# Patient Record
Sex: Male | Born: 1940 | Hispanic: Yes | Marital: Married | Smoking: Former smoker
Health system: Southern US, Community
[De-identification: ages and names within clinical notes are randomized; demographics above are authoritative.]

## PROBLEM LIST (undated history)

## (undated) DIAGNOSIS — I240 Acute coronary thrombosis not resulting in myocardial infarction: Secondary | ICD-10-CM

## (undated) DIAGNOSIS — K219 Gastro-esophageal reflux disease without esophagitis: Secondary | ICD-10-CM

## (undated) DIAGNOSIS — I2510 Atherosclerotic heart disease of native coronary artery without angina pectoris: Secondary | ICD-10-CM

## (undated) DIAGNOSIS — R42 Dizziness and giddiness: Secondary | ICD-10-CM

## (undated) DIAGNOSIS — H409 Unspecified glaucoma: Secondary | ICD-10-CM

## (undated) HISTORY — PX: OTHER SURGICAL HISTORY: SHX169

## (undated) HISTORY — PX: APPENDECTOMY: SHX54

## (undated) HISTORY — PX: CARDIAC CATHETERIZATION: SHX172

## (undated) HISTORY — PX: HERNIA REPAIR: SHX51

---

## 2001-11-05 HISTORY — PX: CORONARY ANGIOPLASTY WITH STENT PLACEMENT: SHX49

## 2011-11-06 DIAGNOSIS — I240 Acute coronary thrombosis not resulting in myocardial infarction: Secondary | ICD-10-CM

## 2011-11-06 HISTORY — DX: Acute coronary thrombosis not resulting in myocardial infarction: I24.0

## 2015-02-21 DIAGNOSIS — R42 Dizziness and giddiness: Secondary | ICD-10-CM

## 2015-02-21 HISTORY — DX: Dizziness and giddiness: R42

## 2018-01-20 ENCOUNTER — Encounter: Payer: Self-pay | Admitting: Emergency Medicine

## 2018-01-21 ENCOUNTER — Ambulatory Visit: Payer: Medicare Other | Admitting: Certified Registered Nurse Anesthetist

## 2018-01-21 ENCOUNTER — Encounter: Payer: Self-pay | Admitting: *Deleted

## 2018-01-21 ENCOUNTER — Ambulatory Visit
Admission: RE | Admit: 2018-01-21 | Discharge: 2018-01-21 | Disposition: A | Discharge status: Home / Self Care | Payer: Medicare Other | Source: Ambulatory Visit | Attending: Unknown Physician Specialty | Admitting: Unknown Physician Specialty

## 2018-01-21 ENCOUNTER — Encounter
Admission: RE | Disposition: A | Discharge status: Home / Self Care | Payer: Self-pay | Source: Ambulatory Visit | Attending: Unknown Physician Specialty

## 2018-01-21 DIAGNOSIS — Z955 Presence of coronary angioplasty implant and graft: Secondary | ICD-10-CM | POA: Diagnosis not present

## 2018-01-21 DIAGNOSIS — K921 Melena: Secondary | ICD-10-CM | POA: Diagnosis present

## 2018-01-21 DIAGNOSIS — H409 Unspecified glaucoma: Secondary | ICD-10-CM | POA: Diagnosis not present

## 2018-01-21 DIAGNOSIS — I2510 Atherosclerotic heart disease of native coronary artery without angina pectoris: Secondary | ICD-10-CM | POA: Diagnosis not present

## 2018-01-21 DIAGNOSIS — Z87891 Personal history of nicotine dependence: Secondary | ICD-10-CM | POA: Diagnosis not present

## 2018-01-21 DIAGNOSIS — K319 Disease of stomach and duodenum, unspecified: Secondary | ICD-10-CM | POA: Insufficient documentation

## 2018-01-21 DIAGNOSIS — K219 Gastro-esophageal reflux disease without esophagitis: Secondary | ICD-10-CM | POA: Diagnosis not present

## 2018-01-21 DIAGNOSIS — Z79899 Other long term (current) drug therapy: Secondary | ICD-10-CM | POA: Insufficient documentation

## 2018-01-21 HISTORY — DX: Gastro-esophageal reflux disease without esophagitis: K21.9

## 2018-01-21 HISTORY — DX: Unspecified glaucoma: H40.9

## 2018-01-21 HISTORY — DX: Acute coronary thrombosis not resulting in myocardial infarction: I24.0

## 2018-01-21 HISTORY — DX: Dizziness and giddiness: R42

## 2018-01-21 HISTORY — PX: ESOPHAGOGASTRODUODENOSCOPY (EGD) WITH PROPOFOL: SHX5813

## 2018-01-21 HISTORY — DX: Atherosclerotic heart disease of native coronary artery without angina pectoris: I25.10

## 2018-01-21 SURGERY — ESOPHAGOGASTRODUODENOSCOPY (EGD) WITH PROPOFOL
Anesthesia: General

## 2018-01-21 MED ORDER — LIDOCAINE HCL (PF) 2 % IJ SOLN
INTRAMUSCULAR | Status: AC
  Filled 2018-01-21: qty 30

## 2018-01-21 MED ORDER — PROPOFOL 500 MG/50ML IV EMUL
INTRAVENOUS | Status: AC
  Filled 2018-01-21: qty 50

## 2018-01-21 MED ORDER — PROPOFOL 500 MG/50ML IV EMUL
INTRAVENOUS | Status: DC | PRN
  Administered 2018-01-21: 160 ug/kg/min via INTRAVENOUS

## 2018-01-21 MED ORDER — PROPOFOL 10 MG/ML IV BOLUS
INTRAVENOUS | Status: DC | PRN
  Administered 2018-01-21: 60 mg via INTRAVENOUS
  Administered 2018-01-21: 20 mg via INTRAVENOUS

## 2018-01-21 MED ORDER — LIDOCAINE HCL (CARDIAC) 20 MG/ML IV SOLN
INTRAVENOUS | Status: DC | PRN
  Administered 2018-01-21: 100 mg via INTRAVENOUS

## 2018-01-21 MED ORDER — SODIUM CHLORIDE 0.9 % IV SOLN: INTRAVENOUS | Status: DC

## 2018-01-21 MED ORDER — SODIUM CHLORIDE 0.9 % IV SOLN
INTRAVENOUS | Status: DC
  Administered 2018-01-21: 15:00:00 via INTRAVENOUS

## 2018-01-21 NOTE — Anesthesia Post-op Follow-up Note (Signed)
Anesthesia QCDR form completed.        

## 2018-01-21 NOTE — Op Note (Signed)
[facility] Gastroenterology Patient Name: Ruben Garza Procedure Date: 01/21/2018 2:51 PM MRN: [id number] Account #: [id number] Date of Birth: July 31, 1941 Admit Type: Outpatient Age: 77 Room: [facility] ENDO ROOM 1 Gender: Male Note Status: Finalized Procedure:            Upper GI endoscopy Indications:          Melena Providers:            Scot Jun, MD Medicines:            Propofol per Anesthesia Complications:        No immediate complications. Procedure:            Pre-Anesthesia Assessment:                       - After reviewing the risks and benefits, the patient                        was deemed in satisfactory condition to undergo the                        procedure.                       After obtaining informed consent, the endoscope was                        passed under direct vision. Throughout the procedure,                        the patient's blood pressure, pulse, and oxygen                        saturations were monitored continuously. The Endoscope                        was introduced through the mouth, and advanced to the                        second part of duodenum. The upper GI endoscopy was                        accomplished without difficulty. The patient tolerated                        the procedure well. Findings:      The examined esophagus was normal. GEJ 40-41cm.      The examined duodenum was normal.      Localized mildly erythematous mucosa with slight grannularity without       bleeding was found in the gastric antrum. Biopsies were taken with a       cold forceps for histology. Biopsies were taken with a cold forceps for       Helicobacter pylori testing.      Diffuse mild inflammation characterized by congestion (edema), erythema       and granularity was found in the gastric body. Biopsies were taken with       a cold forceps for histology. Biopsies were taken with a cold forceps       for Helicobacter pylori  testing. Impression:           - Normal esophagus.                       -  Gastritis. Biopsied.                       - Gastritis. Biopsied.                       - Normal examined duodenum. Recommendation:       - Await pathology results. Stay on medication, consider                        increase dosage strength. Scot Junobert T Warner Laduca, MD 01/21/2018 3:27:32 PM This report has been signed electronically. Number of Addenda: 0 Note Initiated On: 01/21/2018 2:51 PM      [facility]

## 2018-01-21 NOTE — Transfer of Care (Signed)
Immediate Anesthesia Transfer of Care Note  Patient: Ruben Garza  Procedure(s) Performed: ESOPHAGOGASTRODUODENOSCOPY (EGD) WITH PROPOFOL (N/A )  Patient Location: PACU  Anesthesia Type:General  Level of Consciousness: sedated  Airway & Oxygen Therapy: Patient Spontanous Breathing and Patient connected to nasal cannula oxygen  Post-op Assessment: Report given to RN and Post -op Vital signs reviewed and stable  Post vital signs: Reviewed and stable  Last Vitals:  Vitals:   01/21/18 1431  Pulse: 70  Resp: 18  Temp: (!) 35.8 C  SpO2: 100%    Last Pain:  Vitals:   01/21/18 1431  TempSrc: Tympanic      Patients Stated Pain Goal: 0 (01/21/18 1431)  Complications: No apparent anesthesia complications

## 2018-01-21 NOTE — H&P (Signed)
Primary Care Physician:  Lauro RegulusAnderson, Marshall W, MD Primary Gastroenterologist:  Dr. Mechele CollinElliott  Pre-Procedure History & Physical: HPI:  Ruben Garza is a 77 y.o. male is here for an endoscopy.  Done for melena.rightI am    Past Medical History:  Diagnosis Date  . Blockage of coronary artery of heart (HCC) 2013   2 Stints  . Coronary artery disease   . GERD (gastroesophageal reflux disease)   . Glaucoma (increased eye pressure)   . Intermittent vertigo 02/21/2015    Past Surgical History:  Procedure Laterality Date  . APPENDECTOMY    . CARDIAC CATHETERIZATION    . cardiac stents    . CORONARY ANGIOPLASTY WITH STENT PLACEMENT  2003  . HERNIA REPAIR      Prior to Admission medications   Medication Sig Start Date End Date Taking? Authorizing Provider  bimatoprost (LUMIGAN) 0.01 % SOLN Place 1 drop into both eyes at bedtime.   Yes [provider]  brimonidine (ALPHAGAN P) 0.1 % SOLN Place 1 drop into both eyes 2 (two) times daily.   Yes [provider]  Multiple Vitamin (MULTIVITAMIN) tablet Take 1 tablet by mouth daily.   Yes [provider]  omeprazole (PRILOSEC) 20 MG capsule Take 20 mg by mouth daily.   Yes [provider]  albuterol (PROVENTIL HFA;VENTOLIN HFA) 108 (90 Base) MCG/ACT inhaler Inhale 2 puffs into the lungs every 6 (six) hours as needed for wheezing or shortness of breath.    [provider]  bismuth subsalicylate (PEPTO BISMOL) 262 MG chewable tablet Chew 524 mg by mouth as needed.    [provider]    Allergies as of 10/18/2017  . (Not on File)    History reviewed. No pertinent family history.  Social History   Socioeconomic History  . Marital status: Married    Spouse name: Not on file  . Number of children: Not on file  . Years of education: Not on file  . Highest education level: Not on file  Social Needs  . Financial resource strain: Not on file  . Food insecurity - worry: Not on file  .  Food insecurity - inability: Not on file  . Transportation needs - medical: Not on file  . Transportation needs - non-medical: Not on file  Occupational History  . Not on file  Tobacco Use  . Smoking status: Former Games developermoker  . Smokeless tobacco: Never Used  Substance and Sexual Activity  . Alcohol use: Yes    Alcohol/week: 4.2 oz    Types: 7 Glasses of wine per week    Frequency: Never  . Drug use: No  . Sexual activity: Not on file  Other Topics Concern  . Not on file  Social History Narrative  . Not on file    Review of Systems: See HPI, otherwise negative ROS  Physical Exam: Pulse 70   Temp (!) 96.5 F (35.8 C) (Tympanic)   Resp 18   Ht 5\' 9"  (1.753 m)   Wt 77.1 kg (170 lb)   SpO2 100%   BMI 25.10 kg/m  General:   Alert,  pleasant and cooperative in NAD Head:  Normocephalic and atraumatic. Neck:  Supple; no masses or thyromegaly. Lungs:  Clear throughout to auscultation.    Heart:  Regular rate and rhythm. Abdomen:  Soft, nontender and nondistended. Normal bowel sounds, without guarding, and without rebound.   Neurologic:  Alert and  oriented x4;  grossly normal neurologically.  Impression/Plan: Ruben Garza  is here for an endoscopy to be performed for melena.  Risks, benefits, limitations, and alternatives regarding  endoscopy have been reviewed with the patient.  Questions have been answered.  All parties agreeable.   Lynnae Prude, MD  01/21/2018, 2:55 PM

## 2018-01-21 NOTE — Anesthesia Preprocedure Evaluation (Signed)
Anesthesia Evaluation  Patient identified by MRN, date of birth, ID band Patient awake    Reviewed: Allergy & Precautions, NPO status , Patient's Chart, lab work & pertinent test results  History of Anesthesia Complications Negative for: history of anesthetic complications  Airway Mallampati: II  TM Distance: >3 FB Neck ROM: Full    Dental  (+) Implants   Pulmonary neg sleep apnea, neg COPD, former smoker,    breath sounds clear to auscultation- rhonchi (-) wheezing      Cardiovascular Exercise Tolerance: Good (-) angina+ CAD and + Cardiac Stents (2003)  (-) CABG  Rhythm:Regular Rate:Normal - Systolic murmurs and - Diastolic murmurs    Neuro/Psych negative neurological ROS  negative psych ROS   GI/Hepatic Neg liver ROS, GERD  ,  Endo/Other  negative endocrine ROSneg diabetes  Renal/GU negative Renal ROS     Musculoskeletal negative musculoskeletal ROS (+)   Abdominal (+) - obese,   Peds  Hematology negative hematology ROS (+)   Anesthesia Other Findings Past Medical History: 2013: Blockage of coronary artery of heart (HCC)     Comment:  2 Stints No date: Coronary artery disease No date: GERD (gastroesophageal reflux disease) No date: Glaucoma (increased eye pressure) 02/21/2015: Intermittent vertigo   Reproductive/Obstetrics                             Anesthesia Physical Anesthesia Plan  ASA: II  Anesthesia Plan: General   Post-op Pain Management:    Induction: Intravenous  PONV Risk Score and Plan: 1 and Propofol infusion  Airway Management Planned: Natural Airway  Additional Equipment:   Intra-op Plan:   Post-operative Plan:   Informed Consent: I have reviewed the patients History and Physical, chart, labs and discussed the procedure including the risks, benefits and alternatives for the proposed anesthesia with the patient or authorized representative who has  indicated his/her understanding and acceptance.   Dental advisory given  Plan Discussed with: CRNA and Anesthesiologist  Anesthesia Plan Comments:         Anesthesia Quick Evaluation

## 2018-01-22 ENCOUNTER — Encounter: Payer: Self-pay | Admitting: Unknown Physician Specialty

## 2018-01-23 LAB — SURGICAL PATHOLOGY

## 2018-02-19 NOTE — Anesthesia Postprocedure Evaluation (Signed)
Anesthesia Post Note  Patient: Ruben Garza  Procedure(s) Performed: ESOPHAGOGASTRODUODENOSCOPY (EGD) WITH PROPOFOL (N/A )  Patient location during evaluation: Endoscopy Anesthesia Type: General Level of consciousness: awake and alert Pain management: pain level controlled Vital Signs Assessment: post-procedure vital signs reviewed and stable Respiratory status: spontaneous breathing, nonlabored ventilation, respiratory function stable and patient connected to nasal cannula oxygen Cardiovascular status: blood pressure returned to baseline and stable Postop Assessment: no apparent nausea or vomiting Anesthetic complications: no     Last Vitals:  Vitals:   01/21/18 1541 01/21/18 1551  BP: 136/73 140/83  Pulse: (!) 56 (!) 51  Resp: 12 15  Temp:    SpO2: 99% 98%    Last Pain:  Vitals:   01/21/18 1521  TempSrc: Tympanic                 Jaclynn Laumann S

## 2019-01-05 ENCOUNTER — Other Ambulatory Visit: Payer: Self-pay | Admitting: Internal Medicine

## 2019-01-05 DIAGNOSIS — N189 Chronic kidney disease, unspecified: Secondary | ICD-10-CM

## 2019-01-09 ENCOUNTER — Ambulatory Visit
Admission: RE | Admit: 2019-01-09 | Discharge: 2019-01-09 | Disposition: A | Discharge status: Home / Self Care | Payer: Medicare Other | Source: Ambulatory Visit | Attending: Internal Medicine | Admitting: Internal Medicine

## 2019-01-09 DIAGNOSIS — N189 Chronic kidney disease, unspecified: Secondary | ICD-10-CM | POA: Diagnosis not present

## 2019-06-24 ENCOUNTER — Other Ambulatory Visit: Payer: Self-pay

## 2019-06-24 DIAGNOSIS — I2510 Atherosclerotic heart disease of native coronary artery without angina pectoris: Secondary | ICD-10-CM

## 2019-06-24 DIAGNOSIS — R072 Precordial pain: Secondary | ICD-10-CM

## 2019-06-24 DIAGNOSIS — R079 Chest pain, unspecified: Secondary | ICD-10-CM

## 2019-06-24 DIAGNOSIS — I200 Unstable angina: Secondary | ICD-10-CM

## 2019-06-25 NOTE — Progress Notes (Signed)
Coronary CT ordered for Dr Meda Coffee to read for Dr. Frazier Richards at [facility].  Mack Guise CT scheduler will be scheduling this pts Coronary CT and ordering Provider Dr. Tonette Bihari office, will be providing the pt with his Coronary CT instructions.

## 2019-06-25 NOTE — Addendum Note (Signed)
Addended by: Nuala Alpha on: 06/25/2019 12:02 PM   Modules accepted: Orders

## 2019-06-30 ENCOUNTER — Telehealth (HOSPITAL_COMMUNITY): Payer: Self-pay | Admitting: Emergency Medicine

## 2019-06-30 NOTE — Telephone Encounter (Signed)
Left message on voicemail with name and callback number Delsy Etzkorn RN Navigator Cardiac Imaging [facility] [phone] Office [phone] Cell  

## 2019-07-01 ENCOUNTER — Ambulatory Visit: Admission: RE | Admit: 2019-07-01 | Payer: Medicare Other | Source: Ambulatory Visit

## 2019-07-01 ENCOUNTER — Ambulatory Visit
Admission: RE | Admit: 2019-07-01 | Discharge: 2019-07-01 | Disposition: A | Discharge status: Home / Self Care | Payer: Medicare Other | Source: Ambulatory Visit | Attending: Cardiology | Admitting: Cardiology

## 2019-07-01 ENCOUNTER — Other Ambulatory Visit: Payer: Self-pay

## 2019-07-01 DIAGNOSIS — I2510 Atherosclerotic heart disease of native coronary artery without angina pectoris: Secondary | ICD-10-CM

## 2019-07-01 DIAGNOSIS — R072 Precordial pain: Secondary | ICD-10-CM | POA: Diagnosis present

## 2019-07-01 DIAGNOSIS — I200 Unstable angina: Secondary | ICD-10-CM | POA: Diagnosis present

## 2019-07-01 LAB — POCT I-STAT CREATININE: Creatinine, Ser: 1.4 mg/dL — ABNORMAL HIGH (ref 0.61–1.24)

## 2019-07-01 MED ORDER — IOHEXOL 350 MG/ML SOLN
65.0000 mL | Freq: Once | INTRAVENOUS | Status: AC | PRN
  Administered 2019-07-01: 10:00:00 65 mL via INTRAVENOUS

## 2019-07-01 MED ORDER — METOPROLOL TARTRATE 5 MG/5ML IV SOLN
5.0000 mg | INTRAVENOUS | Status: DC | PRN
  Administered 2019-07-01: 5 mg via INTRAVENOUS

## 2019-07-01 MED ORDER — NITROGLYCERIN 0.4 MG SL SUBL: 0.8000 mg | SUBLINGUAL_TABLET | Freq: Once | SUBLINGUAL | Status: DC

## 2019-07-01 MED ORDER — NITROGLYCERIN 0.4 MG SL SUBL
0.4000 mg | SUBLINGUAL_TABLET | Freq: Once | SUBLINGUAL | Status: AC
  Administered 2019-07-01: 0.4 mg via SUBLINGUAL

## 2019-07-01 NOTE — Progress Notes (Signed)
Patient tolerated CT without incident. Gave water and patient ambulated to exit steady gait.

## 2019-07-08 ENCOUNTER — Telehealth: Payer: Self-pay | Admitting: Cardiology

## 2019-07-08 NOTE — Telephone Encounter (Signed)
This is not a Dr. Meda Coffee pt.  This was ordered by Dr. Frazier Richards at [facility].  Mack Guise CT scheduler asked me to place the Coronary CT order in for this outside Provider, for that is the protocol if not ordered by our Cardiologist.  Dr. Meda Coffee simply was the reader that day.  Pt will need to call his the Physician who ordered this, Dr. Ouida Sills, to receive his results, for he is not an established pt with [place], and for HIPPA reasons.  Please call and follow-up.

## 2019-07-08 NOTE — Telephone Encounter (Signed)
Ruben Garza, he is the one that needs a cath, could you call them?

## 2019-07-08 NOTE — Telephone Encounter (Signed)
Dr. Marcheta Grammes office is the one calling with questions , not the pt calling.

## 2019-07-08 NOTE — Telephone Encounter (Signed)
They want to know what the value is, as the results only has CT FFR will be submitted.

## 2019-10-23 ENCOUNTER — Other Ambulatory Visit
Admission: RE | Admit: 2019-10-23 | Discharge: 2019-10-23 | Disposition: A | Discharge status: Home / Self Care | Payer: Medicare Other | Source: Ambulatory Visit | Attending: Internal Medicine | Admitting: Internal Medicine

## 2019-10-23 DIAGNOSIS — Z01812 Encounter for preprocedural laboratory examination: Secondary | ICD-10-CM | POA: Insufficient documentation

## 2019-10-23 LAB — BRAIN NATRIURETIC PEPTIDE: B Natriuretic Peptide: 38 pg/mL (ref 0.0–100.0)

## 2019-10-28 ENCOUNTER — Encounter: Admission: RE | Payer: Self-pay | Source: Home / Self Care

## 2019-10-28 ENCOUNTER — Ambulatory Visit: Admission: RE | Admit: 2019-10-28 | Payer: Medicare Other | Source: Home / Self Care | Admitting: Internal Medicine

## 2019-10-28 SURGERY — LEFT HEART CATH AND CORONARY ANGIOGRAPHY
Anesthesia: Moderate Sedation | Laterality: Left

## 2019-11-17 IMAGING — CT CT HEAR MORPH WITH CTA COR WITH SCORE WITH CA WITH CONTRAST AND
1 of 13 series · 6 of 20 positions shown, 8 images · non-contrast
Comparison: None.

Addendum:
CLINICAL DATA: 78-year-old male with atypical chest pain.

EXAM:
Cardiac/Coronary  CTA
TECHNIQUE: The patient was scanned on a Phillips Force scanner.

[Series 18: multiphase cta coronary · axial · 0.38mm/px · z∈[-1460,-1366]mm · 6 of 3608 slices shown, 8 images]
[im 516/3608  vessel]
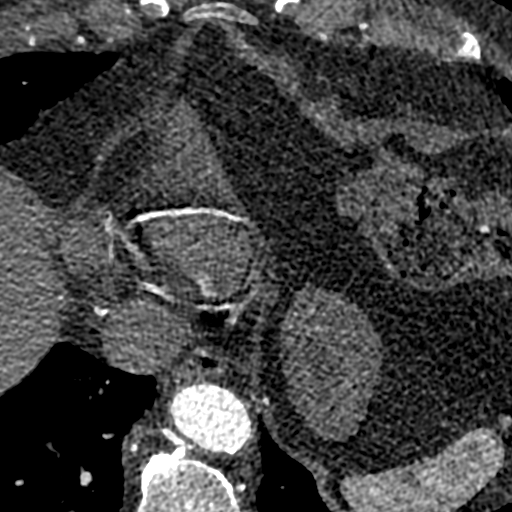
[im 516/3608  lung]
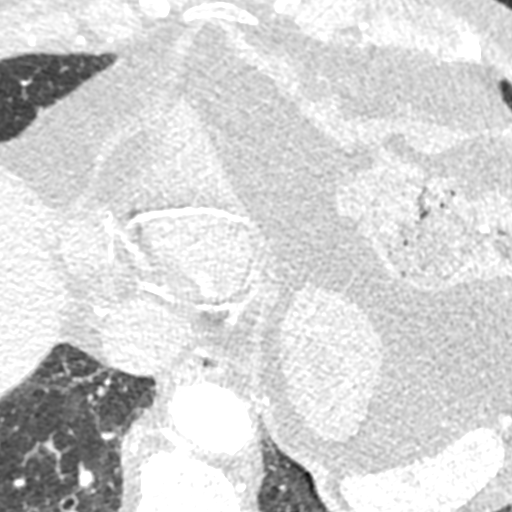
[im 1031/3608  vessel]
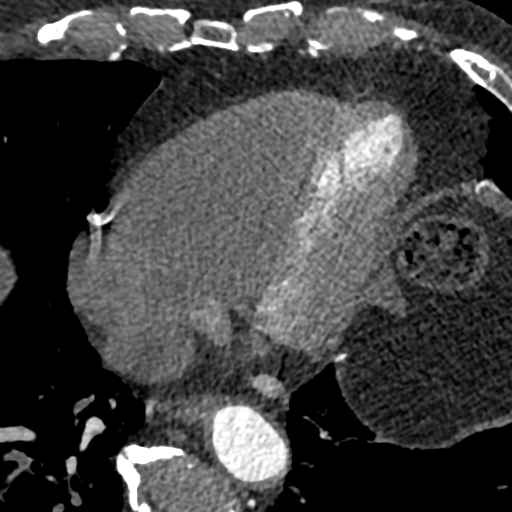
[im 1546/3608  vessel]
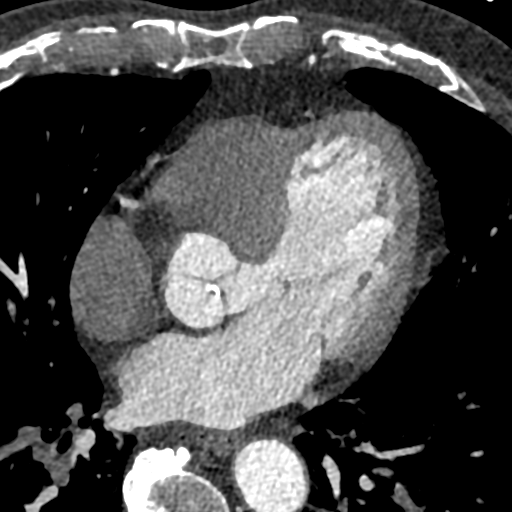
[im 2062/3608  vessel]
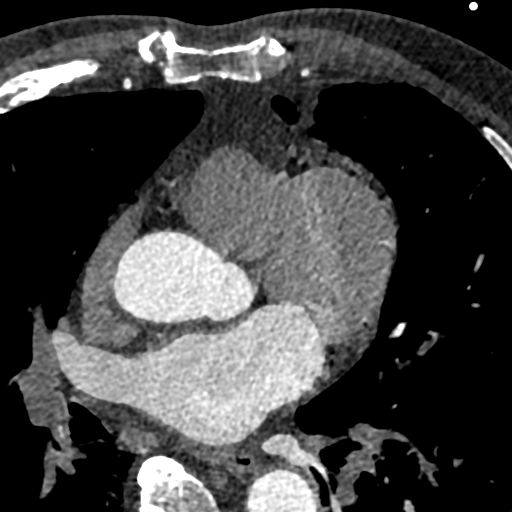
[im 2577/3608  vessel]
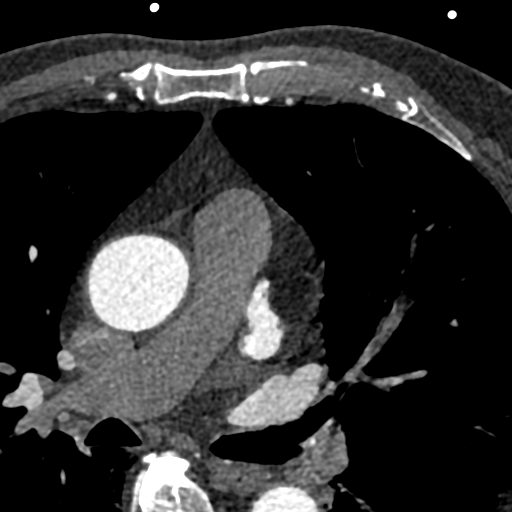
[im 2577/3608  lung]
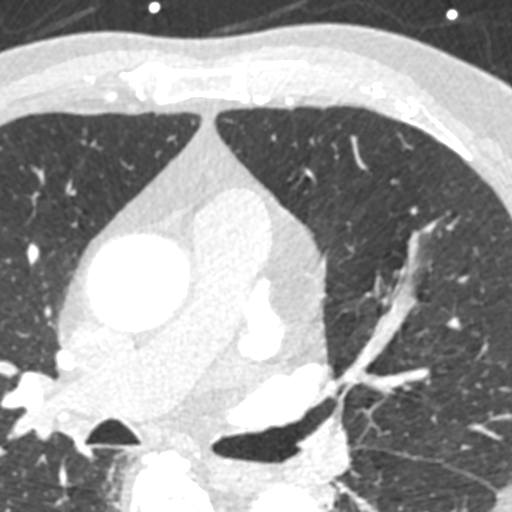
[im 3092/3608  vessel]
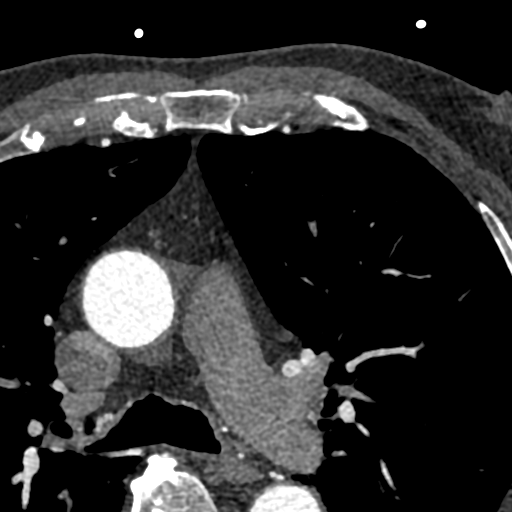

[6 of 20 positions shown; findings below may reference images not displayed]

FINDINGS: A 100 kV prospective scan was triggered in the descending thoracic
aorta at 111 HU's. Axial non-contrast 3 mm slices were carried out
through the heart. The data set was analyzed on a dedicated work
station and scored using the Agatson method. Gantry rotation speed
was 250 msecs and collimation was .6 mm. 5 mg IV metoprolol and
mg of sl NTG was given. The 3D data set was reconstructed in 5%
intervals of the 67-82 % of the R-R cycle. Diastolic phases were
analyzed on a dedicated work station using MPR, MIP and VRT modes.
The patient received 80 cc of contrast.

Aorta: Normal size. Mild diffuse atherosclerotic plaque and
calcifications. No dissection.

Aortic Valve: Trileaflet with moderately calcified leaflets with
good leaflet openings.

Coronary Arteries:  Normal coronary origin.  Right dominance.

RCA is a large dominant artery that gives rise to PDA and PLA. There
is minimal plaque in the proximal and distal RCA and in the PLA and
PDA. Mid RCA has severe long calcified plaque with stenosis
suspicious for > 70%.

Left main is a large artery that gives rise to LAD and LCX arteries.
Left main has minimal calcified plaque in the proximal portion with
associated stenosis 0-25%.

LAD is a large vessel that gives rise to three diagonal arteries.
Proximal LAD has severe mixed plaque at the takeoff of D1 with
stenosis suspicious for > 70%. Mid and distal LAD has small lumen
and minimal plaque.

D1 and D2 are smaller lumen branches originating adjacent to each
other and both have minimal plaque.

D3 is a large artery that has severe, long, circumferential
calcified plaque in the proximal portion with stenosis > 70%.

LCX is a very small lumen non-dominant artery that has no
significant plaque.

Other findings:

Normal pulmonary vein drainage into the left atrium.

Normal left atrial appendage without a thrombus.

Normal size of the pulmonary artery.
IMPRESSION: 1. Coronary calcium score of 3493. This was 94 percentile for age
and sex matched control.

2. Normal coronary origin with right dominance.

3. CAD-RADS 4 Severe stenosis. (70-99%) in the proximal portion of a
large 3. Diagonal artery and possibly in the proximal LAD and mid
RCA. In the presence of symptoms a cardiac catheterization. CT FFR
will be submitted. Consider symptom-guided anti-ischemic
pharmacotherapy as well as risk factor modification per guideline
directed care.

ADDENDUM:
OVER-READ INTERPRETATION  CT CHEST

The following report is an over-read performed by radiologist Dr.
does not include interpretation of cardiac or coronary anatomy or
pathology. The cardiac CTA interpretation by the cardiologist is
attached.
FINDINGS: Vascular: Aortic atherosclerosis. No central pulmonary embolism, on
this non-dedicated study.

Mediastinum/Nodes: No imaged thoracic adenopathy.

Lungs/Pleura: No pleural fluid. Left-sided pulmonary nodules are
identified on series 8, with the largest in the left lower lobe at 7
mm on image 38.

Upper Abdomen: Normal imaged portions of the liver, spleen, stomach.

Musculoskeletal: No acute osseous abnormality.
IMPRESSION: 1.  No acute findings in the imaged extracardiac chest.
2.  Aortic Atherosclerosis ([id number]).
3. Left-sided pulmonary nodules of maximally 7 mm. Non-contrast
chest CT at 6-12 months is recommended. If the nodule is stable at
time of repeat CT, then future CT at 18-24 months (from today's
scan) is considered optional for low-risk patients, but is
recommended for high-risk patients. This recommendation follows the
consensus statement: Guidelines for Management of Incidental
Pulmonary Nodules Detected on CT Images: From the [HOSPITAL]

*** End of Addendum ***
FINDINGS: A 100 kV prospective scan was triggered in the descending thoracic
aorta at 111 HU's. Axial non-contrast 3 mm slices were carried out
through the heart. The data set was analyzed on a dedicated work
station and scored using the Agatson method. Gantry rotation speed
was 250 msecs and collimation was .6 mm. 5 mg IV metoprolol and
mg of sl NTG was given. The 3D data set was reconstructed in 5%
intervals of the 67-82 % of the R-R cycle. Diastolic phases were
analyzed on a dedicated work station using MPR, MIP and VRT modes.
The patient received 80 cc of contrast.

Aorta: Normal size. Mild diffuse atherosclerotic plaque and
calcifications. No dissection.

Aortic Valve: Trileaflet with moderately calcified leaflets with
good leaflet openings.

Coronary Arteries:  Normal coronary origin.  Right dominance.

RCA is a large dominant artery that gives rise to PDA and PLA. There
is minimal plaque in the proximal and distal RCA and in the PLA and
PDA. Mid RCA has severe long calcified plaque with stenosis
suspicious for > 70%.

Left main is a large artery that gives rise to LAD and LCX arteries.
Left main has minimal calcified plaque in the proximal portion with
associated stenosis 0-25%.

LAD is a large vessel that gives rise to three diagonal arteries.
Proximal LAD has severe mixed plaque at the takeoff of D1 with
stenosis suspicious for > 70%. Mid and distal LAD has small lumen
and minimal plaque.

D1 and D2 are smaller lumen branches originating adjacent to each
other and both have minimal plaque.

D3 is a large artery that has severe, long, circumferential
calcified plaque in the proximal portion with stenosis > 70%.

LCX is a very small lumen non-dominant artery that has no
significant plaque.

Other findings:

Normal pulmonary vein drainage into the left atrium.

Normal left atrial appendage without a thrombus.

Normal size of the pulmonary artery.
IMPRESSION: 1. Coronary calcium score of 3493. This was 94 percentile for age
and sex matched control.

2. Normal coronary origin with right dominance.

3. CAD-RADS 4 Severe stenosis. (70-99%) in the proximal portion of a
large 3. Diagonal artery and possibly in the proximal LAD and mid
RCA. In the presence of symptoms a cardiac catheterization. CT FFR
will be submitted. Consider symptom-guided anti-ischemic
pharmacotherapy as well as risk factor modification per guideline
directed care.

## 2019-11-18 DIAGNOSIS — R0789 Other chest pain: Secondary | ICD-10-CM | POA: Diagnosis not present

## 2019-11-18 DIAGNOSIS — K219 Gastro-esophageal reflux disease without esophagitis: Secondary | ICD-10-CM | POA: Diagnosis not present

## 2019-11-18 DIAGNOSIS — Z955 Presence of coronary angioplasty implant and graft: Secondary | ICD-10-CM | POA: Diagnosis not present

## 2019-11-18 DIAGNOSIS — R310 Gross hematuria: Secondary | ICD-10-CM | POA: Diagnosis not present

## 2019-11-18 DIAGNOSIS — R0602 Shortness of breath: Secondary | ICD-10-CM | POA: Diagnosis not present

## 2019-11-18 DIAGNOSIS — R42 Dizziness and giddiness: Secondary | ICD-10-CM | POA: Diagnosis not present

## 2019-11-18 DIAGNOSIS — I25119 Atherosclerotic heart disease of native coronary artery with unspecified angina pectoris: Secondary | ICD-10-CM | POA: Diagnosis not present

## 2019-11-18 DIAGNOSIS — I208 Other forms of angina pectoris: Secondary | ICD-10-CM | POA: Diagnosis not present

## 2019-12-10 DIAGNOSIS — I209 Angina pectoris, unspecified: Secondary | ICD-10-CM | POA: Diagnosis not present

## 2019-12-10 DIAGNOSIS — H524 Presbyopia: Secondary | ICD-10-CM | POA: Diagnosis not present

## 2019-12-10 DIAGNOSIS — R0602 Shortness of breath: Secondary | ICD-10-CM | POA: Diagnosis not present

## 2019-12-10 DIAGNOSIS — I25118 Atherosclerotic heart disease of native coronary artery with other forms of angina pectoris: Secondary | ICD-10-CM | POA: Diagnosis not present

## 2019-12-10 DIAGNOSIS — H5213 Myopia, bilateral: Secondary | ICD-10-CM | POA: Diagnosis not present

## 2019-12-10 DIAGNOSIS — H52209 Unspecified astigmatism, unspecified eye: Secondary | ICD-10-CM | POA: Diagnosis not present

## 2019-12-10 DIAGNOSIS — H5203 Hypermetropia, bilateral: Secondary | ICD-10-CM | POA: Diagnosis not present

## 2019-12-18 DIAGNOSIS — R7303 Prediabetes: Secondary | ICD-10-CM | POA: Diagnosis not present

## 2019-12-18 DIAGNOSIS — K219 Gastro-esophageal reflux disease without esophagitis: Secondary | ICD-10-CM | POA: Diagnosis not present

## 2019-12-18 DIAGNOSIS — I25118 Atherosclerotic heart disease of native coronary artery with other forms of angina pectoris: Secondary | ICD-10-CM | POA: Diagnosis not present

## 2019-12-18 DIAGNOSIS — T82855A Stenosis of coronary artery stent, initial encounter: Secondary | ICD-10-CM | POA: Diagnosis not present

## 2019-12-18 DIAGNOSIS — R001 Bradycardia, unspecified: Secondary | ICD-10-CM | POA: Diagnosis not present

## 2019-12-18 DIAGNOSIS — R6889 Other general symptoms and signs: Secondary | ICD-10-CM | POA: Diagnosis not present

## 2019-12-18 DIAGNOSIS — R9431 Abnormal electrocardiogram [ECG] [EKG]: Secondary | ICD-10-CM | POA: Diagnosis not present

## 2019-12-18 DIAGNOSIS — I441 Atrioventricular block, second degree: Secondary | ICD-10-CM | POA: Diagnosis not present

## 2019-12-18 DIAGNOSIS — R0602 Shortness of breath: Secondary | ICD-10-CM | POA: Diagnosis not present

## 2019-12-18 DIAGNOSIS — Z87891 Personal history of nicotine dependence: Secondary | ICD-10-CM | POA: Diagnosis not present

## 2019-12-18 DIAGNOSIS — R079 Chest pain, unspecified: Secondary | ICD-10-CM | POA: Diagnosis not present

## 2019-12-18 DIAGNOSIS — Z955 Presence of coronary angioplasty implant and graft: Secondary | ICD-10-CM | POA: Diagnosis not present

## 2019-12-23 DIAGNOSIS — E782 Mixed hyperlipidemia: Secondary | ICD-10-CM | POA: Diagnosis not present

## 2019-12-23 DIAGNOSIS — I441 Atrioventricular block, second degree: Secondary | ICD-10-CM | POA: Diagnosis not present

## 2019-12-23 DIAGNOSIS — I2510 Atherosclerotic heart disease of native coronary artery without angina pectoris: Secondary | ICD-10-CM | POA: Diagnosis not present

## 2019-12-23 DIAGNOSIS — Z87891 Personal history of nicotine dependence: Secondary | ICD-10-CM | POA: Diagnosis not present

## 2020-02-04 DIAGNOSIS — H401131 Primary open-angle glaucoma, bilateral, mild stage: Secondary | ICD-10-CM | POA: Diagnosis not present

## 2020-04-19 DIAGNOSIS — Z125 Encounter for screening for malignant neoplasm of prostate: Secondary | ICD-10-CM | POA: Diagnosis not present

## 2020-04-19 DIAGNOSIS — I25119 Atherosclerotic heart disease of native coronary artery with unspecified angina pectoris: Secondary | ICD-10-CM | POA: Diagnosis not present

## 2020-04-19 DIAGNOSIS — R799 Abnormal finding of blood chemistry, unspecified: Secondary | ICD-10-CM | POA: Diagnosis not present

## 2020-04-21 DIAGNOSIS — I25119 Atherosclerotic heart disease of native coronary artery with unspecified angina pectoris: Secondary | ICD-10-CM | POA: Diagnosis not present

## 2020-04-21 DIAGNOSIS — R7303 Prediabetes: Secondary | ICD-10-CM | POA: Diagnosis not present

## 2020-04-21 DIAGNOSIS — N529 Male erectile dysfunction, unspecified: Secondary | ICD-10-CM | POA: Diagnosis not present

## 2020-04-21 DIAGNOSIS — Z87891 Personal history of nicotine dependence: Secondary | ICD-10-CM | POA: Diagnosis not present

## 2020-04-22 DIAGNOSIS — E782 Mixed hyperlipidemia: Secondary | ICD-10-CM | POA: Diagnosis not present

## 2020-04-22 DIAGNOSIS — I209 Angina pectoris, unspecified: Secondary | ICD-10-CM | POA: Diagnosis not present

## 2020-04-22 DIAGNOSIS — K219 Gastro-esophageal reflux disease without esophagitis: Secondary | ICD-10-CM | POA: Diagnosis not present

## 2020-04-22 DIAGNOSIS — R0602 Shortness of breath: Secondary | ICD-10-CM | POA: Diagnosis not present

## 2020-04-22 DIAGNOSIS — R001 Bradycardia, unspecified: Secondary | ICD-10-CM | POA: Diagnosis not present

## 2020-04-22 DIAGNOSIS — I2510 Atherosclerotic heart disease of native coronary artery without angina pectoris: Secondary | ICD-10-CM | POA: Diagnosis not present

## 2020-04-22 DIAGNOSIS — Z955 Presence of coronary angioplasty implant and graft: Secondary | ICD-10-CM | POA: Diagnosis not present

## 2020-06-03 DIAGNOSIS — H401131 Primary open-angle glaucoma, bilateral, mild stage: Secondary | ICD-10-CM | POA: Diagnosis not present

## 2020-06-08 DIAGNOSIS — I25118 Atherosclerotic heart disease of native coronary artery with other forms of angina pectoris: Secondary | ICD-10-CM | POA: Diagnosis not present

## 2020-06-08 DIAGNOSIS — E782 Mixed hyperlipidemia: Secondary | ICD-10-CM | POA: Diagnosis not present

## 2020-06-08 DIAGNOSIS — I441 Atrioventricular block, second degree: Secondary | ICD-10-CM | POA: Diagnosis not present

## 2020-07-06 DIAGNOSIS — I25118 Atherosclerotic heart disease of native coronary artery with other forms of angina pectoris: Secondary | ICD-10-CM | POA: Diagnosis not present

## 2020-07-06 DIAGNOSIS — I441 Atrioventricular block, second degree: Secondary | ICD-10-CM | POA: Diagnosis not present

## 2020-07-06 DIAGNOSIS — E782 Mixed hyperlipidemia: Secondary | ICD-10-CM | POA: Diagnosis not present

## 2020-08-10 DIAGNOSIS — E782 Mixed hyperlipidemia: Secondary | ICD-10-CM | POA: Diagnosis not present

## 2020-08-10 DIAGNOSIS — I25118 Atherosclerotic heart disease of native coronary artery with other forms of angina pectoris: Secondary | ICD-10-CM | POA: Diagnosis not present

## 2020-08-10 DIAGNOSIS — I441 Atrioventricular block, second degree: Secondary | ICD-10-CM | POA: Diagnosis not present

## 2020-09-08 DIAGNOSIS — Z20822 Contact with and (suspected) exposure to covid-19: Secondary | ICD-10-CM | POA: Diagnosis not present

## 2020-09-09 DIAGNOSIS — Z20822 Contact with and (suspected) exposure to covid-19: Secondary | ICD-10-CM | POA: Diagnosis not present

## 2020-09-14 DIAGNOSIS — Z20822 Contact with and (suspected) exposure to covid-19: Secondary | ICD-10-CM | POA: Diagnosis not present

## 2020-10-17 DIAGNOSIS — R7303 Prediabetes: Secondary | ICD-10-CM | POA: Diagnosis not present

## 2020-10-17 DIAGNOSIS — I25119 Atherosclerotic heart disease of native coronary artery with unspecified angina pectoris: Secondary | ICD-10-CM | POA: Diagnosis not present

## 2020-10-20 DIAGNOSIS — R0602 Shortness of breath: Secondary | ICD-10-CM | POA: Diagnosis not present

## 2020-10-20 DIAGNOSIS — R55 Syncope and collapse: Secondary | ICD-10-CM | POA: Diagnosis not present

## 2020-10-20 DIAGNOSIS — E782 Mixed hyperlipidemia: Secondary | ICD-10-CM | POA: Diagnosis not present

## 2020-10-20 DIAGNOSIS — I25118 Atherosclerotic heart disease of native coronary artery with other forms of angina pectoris: Secondary | ICD-10-CM | POA: Diagnosis not present

## 2020-10-20 DIAGNOSIS — Z955 Presence of coronary angioplasty implant and graft: Secondary | ICD-10-CM | POA: Diagnosis not present

## 2020-10-20 DIAGNOSIS — K219 Gastro-esophageal reflux disease without esophagitis: Secondary | ICD-10-CM | POA: Diagnosis not present

## 2020-10-20 DIAGNOSIS — R0989 Other specified symptoms and signs involving the circulatory and respiratory systems: Secondary | ICD-10-CM | POA: Diagnosis not present

## 2020-10-20 DIAGNOSIS — I209 Angina pectoris, unspecified: Secondary | ICD-10-CM | POA: Diagnosis not present

## 2020-10-20 DIAGNOSIS — Z23 Encounter for immunization: Secondary | ICD-10-CM | POA: Diagnosis not present

## 2020-10-24 DIAGNOSIS — I25119 Atherosclerotic heart disease of native coronary artery with unspecified angina pectoris: Secondary | ICD-10-CM | POA: Diagnosis not present

## 2020-10-24 DIAGNOSIS — Z5320 Procedure and treatment not carried out because of patient's decision for unspecified reasons: Secondary | ICD-10-CM | POA: Diagnosis not present

## 2020-10-24 DIAGNOSIS — R7303 Prediabetes: Secondary | ICD-10-CM | POA: Diagnosis not present

## 2020-10-24 DIAGNOSIS — Z125 Encounter for screening for malignant neoplasm of prostate: Secondary | ICD-10-CM | POA: Diagnosis not present

## 2020-10-24 DIAGNOSIS — Z0000 Encounter for general adult medical examination without abnormal findings: Secondary | ICD-10-CM | POA: Diagnosis not present

## 2020-11-02 DIAGNOSIS — I6523 Occlusion and stenosis of bilateral carotid arteries: Secondary | ICD-10-CM | POA: Diagnosis not present

## 2020-11-02 DIAGNOSIS — R0989 Other specified symptoms and signs involving the circulatory and respiratory systems: Secondary | ICD-10-CM | POA: Diagnosis not present

## 2020-11-11 DIAGNOSIS — Z1211 Encounter for screening for malignant neoplasm of colon: Secondary | ICD-10-CM | POA: Diagnosis not present

## 2020-11-16 DIAGNOSIS — I25119 Atherosclerotic heart disease of native coronary artery with unspecified angina pectoris: Secondary | ICD-10-CM | POA: Diagnosis not present

## 2020-11-16 DIAGNOSIS — K219 Gastro-esophageal reflux disease without esophagitis: Secondary | ICD-10-CM | POA: Diagnosis not present

## 2020-11-16 DIAGNOSIS — Z20822 Contact with and (suspected) exposure to covid-19: Secondary | ICD-10-CM | POA: Diagnosis not present

## 2020-11-16 DIAGNOSIS — R55 Syncope and collapse: Secondary | ICD-10-CM | POA: Diagnosis not present

## 2020-11-16 DIAGNOSIS — I441 Atrioventricular block, second degree: Secondary | ICD-10-CM | POA: Diagnosis not present

## 2020-11-16 DIAGNOSIS — R7303 Prediabetes: Secondary | ICD-10-CM | POA: Diagnosis not present

## 2020-11-16 DIAGNOSIS — I6523 Occlusion and stenosis of bilateral carotid arteries: Secondary | ICD-10-CM | POA: Diagnosis not present

## 2020-11-16 DIAGNOSIS — Z955 Presence of coronary angioplasty implant and graft: Secondary | ICD-10-CM | POA: Diagnosis not present

## 2020-11-30 DIAGNOSIS — H401131 Primary open-angle glaucoma, bilateral, mild stage: Secondary | ICD-10-CM | POA: Diagnosis not present

## 2020-11-30 DIAGNOSIS — H2512 Age-related nuclear cataract, left eye: Secondary | ICD-10-CM | POA: Diagnosis not present

## 2020-11-30 DIAGNOSIS — Z961 Presence of intraocular lens: Secondary | ICD-10-CM | POA: Diagnosis not present

## 2021-03-08 DIAGNOSIS — I441 Atrioventricular block, second degree: Secondary | ICD-10-CM | POA: Diagnosis not present

## 2021-03-08 DIAGNOSIS — E782 Mixed hyperlipidemia: Secondary | ICD-10-CM | POA: Diagnosis not present

## 2021-03-08 DIAGNOSIS — Z955 Presence of coronary angioplasty implant and graft: Secondary | ICD-10-CM | POA: Diagnosis not present

## 2021-03-08 DIAGNOSIS — I25119 Atherosclerotic heart disease of native coronary artery with unspecified angina pectoris: Secondary | ICD-10-CM | POA: Diagnosis not present

## 2021-04-10 DIAGNOSIS — L601 Onycholysis: Secondary | ICD-10-CM | POA: Diagnosis not present

## 2021-04-10 DIAGNOSIS — M79675 Pain in left toe(s): Secondary | ICD-10-CM | POA: Diagnosis not present

## 2021-04-10 DIAGNOSIS — B351 Tinea unguium: Secondary | ICD-10-CM | POA: Diagnosis not present

## 2021-04-10 DIAGNOSIS — R7303 Prediabetes: Secondary | ICD-10-CM | POA: Diagnosis not present

## 2021-04-10 DIAGNOSIS — M79674 Pain in right toe(s): Secondary | ICD-10-CM | POA: Diagnosis not present

## 2021-04-21 DIAGNOSIS — H2512 Age-related nuclear cataract, left eye: Secondary | ICD-10-CM | POA: Diagnosis not present

## 2021-04-21 DIAGNOSIS — H401131 Primary open-angle glaucoma, bilateral, mild stage: Secondary | ICD-10-CM | POA: Diagnosis not present

## 2021-04-21 DIAGNOSIS — Z961 Presence of intraocular lens: Secondary | ICD-10-CM | POA: Diagnosis not present

## 2021-05-15 DIAGNOSIS — H9313 Tinnitus, bilateral: Secondary | ICD-10-CM | POA: Diagnosis not present

## 2021-05-15 DIAGNOSIS — H903 Sensorineural hearing loss, bilateral: Secondary | ICD-10-CM | POA: Diagnosis not present

## 2021-05-15 DIAGNOSIS — H6123 Impacted cerumen, bilateral: Secondary | ICD-10-CM | POA: Diagnosis not present

## 2021-07-03 DIAGNOSIS — I25119 Atherosclerotic heart disease of native coronary artery with unspecified angina pectoris: Secondary | ICD-10-CM | POA: Diagnosis not present

## 2021-07-03 DIAGNOSIS — I209 Angina pectoris, unspecified: Secondary | ICD-10-CM | POA: Diagnosis not present

## 2021-07-03 DIAGNOSIS — E782 Mixed hyperlipidemia: Secondary | ICD-10-CM | POA: Diagnosis not present

## 2021-07-03 DIAGNOSIS — Z955 Presence of coronary angioplasty implant and graft: Secondary | ICD-10-CM | POA: Diagnosis not present

## 2021-07-03 DIAGNOSIS — K219 Gastro-esophageal reflux disease without esophagitis: Secondary | ICD-10-CM | POA: Diagnosis not present

## 2021-07-03 DIAGNOSIS — I441 Atrioventricular block, second degree: Secondary | ICD-10-CM | POA: Diagnosis not present

## 2021-08-24 DIAGNOSIS — M79605 Pain in left leg: Secondary | ICD-10-CM | POA: Diagnosis not present

## 2021-08-24 DIAGNOSIS — I83811 Varicose veins of right lower extremities with pain: Secondary | ICD-10-CM | POA: Diagnosis not present

## 2021-08-24 DIAGNOSIS — I8312 Varicose veins of left lower extremity with inflammation: Secondary | ICD-10-CM | POA: Diagnosis not present

## 2021-09-04 DIAGNOSIS — I8311 Varicose veins of right lower extremity with inflammation: Secondary | ICD-10-CM | POA: Diagnosis not present

## 2021-09-04 DIAGNOSIS — I83811 Varicose veins of right lower extremities with pain: Secondary | ICD-10-CM | POA: Diagnosis not present

## 2021-09-04 DIAGNOSIS — I83891 Varicose veins of right lower extremities with other complications: Secondary | ICD-10-CM | POA: Diagnosis not present

## 2021-11-13 DIAGNOSIS — I83892 Varicose veins of left lower extremities with other complications: Secondary | ICD-10-CM | POA: Diagnosis not present

## 2021-11-20 DIAGNOSIS — I83812 Varicose veins of left lower extremities with pain: Secondary | ICD-10-CM | POA: Diagnosis not present

## 2021-11-20 DIAGNOSIS — Z09 Encounter for follow-up examination after completed treatment for conditions other than malignant neoplasm: Secondary | ICD-10-CM | POA: Diagnosis not present

## 2021-11-27 DIAGNOSIS — I83812 Varicose veins of left lower extremities with pain: Secondary | ICD-10-CM | POA: Diagnosis not present

## 2021-11-27 DIAGNOSIS — I83892 Varicose veins of left lower extremities with other complications: Secondary | ICD-10-CM | POA: Diagnosis not present

## 2021-11-27 DIAGNOSIS — Z09 Encounter for follow-up examination after completed treatment for conditions other than malignant neoplasm: Secondary | ICD-10-CM | POA: Diagnosis not present

## 2021-12-07 DIAGNOSIS — H25812 Combined forms of age-related cataract, left eye: Secondary | ICD-10-CM | POA: Diagnosis not present

## 2021-12-07 DIAGNOSIS — H02889 Meibomian gland dysfunction of unspecified eye, unspecified eyelid: Secondary | ICD-10-CM | POA: Diagnosis not present

## 2021-12-07 DIAGNOSIS — Z961 Presence of intraocular lens: Secondary | ICD-10-CM | POA: Diagnosis not present

## 2021-12-07 DIAGNOSIS — H401131 Primary open-angle glaucoma, bilateral, mild stage: Secondary | ICD-10-CM | POA: Diagnosis not present

## 2021-12-11 DIAGNOSIS — Z5320 Procedure and treatment not carried out because of patient's decision for unspecified reasons: Secondary | ICD-10-CM | POA: Diagnosis not present

## 2021-12-11 DIAGNOSIS — I25119 Atherosclerotic heart disease of native coronary artery with unspecified angina pectoris: Secondary | ICD-10-CM | POA: Diagnosis not present

## 2021-12-11 DIAGNOSIS — Z0000 Encounter for general adult medical examination without abnormal findings: Secondary | ICD-10-CM | POA: Diagnosis not present

## 2021-12-11 DIAGNOSIS — Z23 Encounter for immunization: Secondary | ICD-10-CM | POA: Diagnosis not present

## 2021-12-11 DIAGNOSIS — R7303 Prediabetes: Secondary | ICD-10-CM | POA: Diagnosis not present

## 2021-12-11 DIAGNOSIS — Z1389 Encounter for screening for other disorder: Secondary | ICD-10-CM | POA: Diagnosis not present

## 2021-12-14 DIAGNOSIS — I83812 Varicose veins of left lower extremities with pain: Secondary | ICD-10-CM | POA: Diagnosis not present

## 2021-12-14 DIAGNOSIS — R7303 Prediabetes: Secondary | ICD-10-CM | POA: Diagnosis not present

## 2021-12-14 DIAGNOSIS — I25119 Atherosclerotic heart disease of native coronary artery with unspecified angina pectoris: Secondary | ICD-10-CM | POA: Diagnosis not present

## 2021-12-14 DIAGNOSIS — I83892 Varicose veins of left lower extremities with other complications: Secondary | ICD-10-CM | POA: Diagnosis not present

## 2021-12-25 DIAGNOSIS — I209 Angina pectoris, unspecified: Secondary | ICD-10-CM | POA: Diagnosis not present

## 2021-12-25 DIAGNOSIS — I25119 Atherosclerotic heart disease of native coronary artery with unspecified angina pectoris: Secondary | ICD-10-CM | POA: Diagnosis not present

## 2021-12-25 DIAGNOSIS — Z955 Presence of coronary angioplasty implant and graft: Secondary | ICD-10-CM | POA: Diagnosis not present

## 2021-12-25 DIAGNOSIS — I2510 Atherosclerotic heart disease of native coronary artery without angina pectoris: Secondary | ICD-10-CM | POA: Diagnosis not present

## 2022-01-25 DIAGNOSIS — H25812 Combined forms of age-related cataract, left eye: Secondary | ICD-10-CM | POA: Diagnosis not present

## 2022-02-05 DIAGNOSIS — H401121 Primary open-angle glaucoma, left eye, mild stage: Secondary | ICD-10-CM | POA: Diagnosis not present

## 2022-02-05 DIAGNOSIS — H2512 Age-related nuclear cataract, left eye: Secondary | ICD-10-CM | POA: Diagnosis not present

## 2022-02-05 DIAGNOSIS — H4010X1 Unspecified open-angle glaucoma, mild stage: Secondary | ICD-10-CM | POA: Diagnosis not present

## 2022-02-05 DIAGNOSIS — H25812 Combined forms of age-related cataract, left eye: Secondary | ICD-10-CM | POA: Diagnosis not present

## 2022-02-05 DIAGNOSIS — R6889 Other general symptoms and signs: Secondary | ICD-10-CM | POA: Diagnosis not present

## 2022-02-05 DIAGNOSIS — H278 Other specified disorders of lens: Secondary | ICD-10-CM | POA: Diagnosis not present

## 2022-02-06 DIAGNOSIS — R6889 Other general symptoms and signs: Secondary | ICD-10-CM | POA: Diagnosis not present

## 2022-02-08 DIAGNOSIS — N2581 Secondary hyperparathyroidism of renal origin: Secondary | ICD-10-CM | POA: Diagnosis not present

## 2022-02-08 DIAGNOSIS — I25119 Atherosclerotic heart disease of native coronary artery with unspecified angina pectoris: Secondary | ICD-10-CM | POA: Diagnosis not present

## 2022-02-08 DIAGNOSIS — I459 Conduction disorder, unspecified: Secondary | ICD-10-CM | POA: Diagnosis not present

## 2022-02-08 DIAGNOSIS — R7303 Prediabetes: Secondary | ICD-10-CM | POA: Diagnosis not present

## 2022-02-08 DIAGNOSIS — E7800 Pure hypercholesterolemia, unspecified: Secondary | ICD-10-CM | POA: Diagnosis not present

## 2022-02-08 DIAGNOSIS — H4050X0 Glaucoma secondary to other eye disorders, unspecified eye, stage unspecified: Secondary | ICD-10-CM | POA: Diagnosis not present

## 2022-02-08 DIAGNOSIS — R8290 Unspecified abnormal findings in urine: Secondary | ICD-10-CM | POA: Diagnosis not present

## 2022-02-08 DIAGNOSIS — N1831 Chronic kidney disease, stage 3a: Secondary | ICD-10-CM | POA: Diagnosis not present

## 2022-02-08 DIAGNOSIS — I10 Essential (primary) hypertension: Secondary | ICD-10-CM | POA: Diagnosis not present

## 2022-02-08 DIAGNOSIS — K219 Gastro-esophageal reflux disease without esophagitis: Secondary | ICD-10-CM | POA: Diagnosis not present

## 2022-02-13 ENCOUNTER — Other Ambulatory Visit: Payer: Self-pay | Admitting: Nephrology

## 2022-02-13 DIAGNOSIS — I459 Conduction disorder, unspecified: Secondary | ICD-10-CM

## 2022-02-13 DIAGNOSIS — H4050X0 Glaucoma secondary to other eye disorders, unspecified eye, stage unspecified: Secondary | ICD-10-CM

## 2022-02-13 DIAGNOSIS — E7800 Pure hypercholesterolemia, unspecified: Secondary | ICD-10-CM

## 2022-02-13 DIAGNOSIS — R7303 Prediabetes: Secondary | ICD-10-CM

## 2022-02-13 DIAGNOSIS — N1831 Chronic kidney disease, stage 3a: Secondary | ICD-10-CM

## 2022-02-13 DIAGNOSIS — R8290 Unspecified abnormal findings in urine: Secondary | ICD-10-CM

## 2022-02-13 DIAGNOSIS — I159 Secondary hypertension, unspecified: Secondary | ICD-10-CM

## 2022-02-20 DIAGNOSIS — H526 Other disorders of refraction: Secondary | ICD-10-CM | POA: Diagnosis not present

## 2022-02-20 DIAGNOSIS — H52209 Unspecified astigmatism, unspecified eye: Secondary | ICD-10-CM | POA: Diagnosis not present

## 2022-02-20 DIAGNOSIS — H5213 Myopia, bilateral: Secondary | ICD-10-CM | POA: Diagnosis not present

## 2022-02-20 DIAGNOSIS — H524 Presbyopia: Secondary | ICD-10-CM | POA: Diagnosis not present

## 2022-03-01 DIAGNOSIS — M7989 Other specified soft tissue disorders: Secondary | ICD-10-CM | POA: Diagnosis not present

## 2022-03-01 DIAGNOSIS — I83812 Varicose veins of left lower extremities with pain: Secondary | ICD-10-CM | POA: Diagnosis not present

## 2022-03-01 DIAGNOSIS — I83892 Varicose veins of left lower extremities with other complications: Secondary | ICD-10-CM | POA: Diagnosis not present

## 2022-03-22 DIAGNOSIS — I25119 Atherosclerotic heart disease of native coronary artery with unspecified angina pectoris: Secondary | ICD-10-CM | POA: Diagnosis not present

## 2022-03-22 DIAGNOSIS — N200 Calculus of kidney: Secondary | ICD-10-CM | POA: Diagnosis not present

## 2022-03-22 DIAGNOSIS — R7303 Prediabetes: Secondary | ICD-10-CM | POA: Diagnosis not present

## 2022-03-22 DIAGNOSIS — N1831 Chronic kidney disease, stage 3a: Secondary | ICD-10-CM | POA: Diagnosis not present

## 2022-03-22 DIAGNOSIS — H4050X0 Glaucoma secondary to other eye disorders, unspecified eye, stage unspecified: Secondary | ICD-10-CM | POA: Diagnosis not present

## 2022-03-22 DIAGNOSIS — K219 Gastro-esophageal reflux disease without esophagitis: Secondary | ICD-10-CM | POA: Diagnosis not present

## 2022-03-22 DIAGNOSIS — I459 Conduction disorder, unspecified: Secondary | ICD-10-CM | POA: Diagnosis not present

## 2022-03-22 DIAGNOSIS — I10 Essential (primary) hypertension: Secondary | ICD-10-CM | POA: Diagnosis not present

## 2022-03-22 DIAGNOSIS — E7800 Pure hypercholesterolemia, unspecified: Secondary | ICD-10-CM | POA: Diagnosis not present

## 2022-03-23 ENCOUNTER — Other Ambulatory Visit: Payer: Self-pay | Admitting: Nephrology

## 2022-03-23 DIAGNOSIS — E7800 Pure hypercholesterolemia, unspecified: Secondary | ICD-10-CM

## 2022-03-23 DIAGNOSIS — R8290 Unspecified abnormal findings in urine: Secondary | ICD-10-CM

## 2022-03-23 DIAGNOSIS — N1831 Chronic kidney disease, stage 3a: Secondary | ICD-10-CM

## 2022-03-23 DIAGNOSIS — I459 Conduction disorder, unspecified: Secondary | ICD-10-CM

## 2022-03-23 DIAGNOSIS — I159 Secondary hypertension, unspecified: Secondary | ICD-10-CM

## 2022-03-23 DIAGNOSIS — R7303 Prediabetes: Secondary | ICD-10-CM

## 2022-03-23 DIAGNOSIS — H4050X0 Glaucoma secondary to other eye disorders, unspecified eye, stage unspecified: Secondary | ICD-10-CM

## 2022-04-12 ENCOUNTER — Ambulatory Visit
Admission: RE | Admit: 2022-04-12 | Discharge: 2022-04-12 | Disposition: A | Discharge status: Home / Self Care | Payer: Medicare HMO | Source: Ambulatory Visit | Attending: Internal Medicine | Admitting: Internal Medicine

## 2022-04-12 DIAGNOSIS — I459 Conduction disorder, unspecified: Secondary | ICD-10-CM | POA: Diagnosis not present

## 2022-04-12 DIAGNOSIS — E7800 Pure hypercholesterolemia, unspecified: Secondary | ICD-10-CM | POA: Insufficient documentation

## 2022-04-12 DIAGNOSIS — N1831 Chronic kidney disease, stage 3a: Secondary | ICD-10-CM | POA: Insufficient documentation

## 2022-04-12 DIAGNOSIS — R7303 Prediabetes: Secondary | ICD-10-CM | POA: Diagnosis not present

## 2022-04-12 DIAGNOSIS — I159 Secondary hypertension, unspecified: Secondary | ICD-10-CM | POA: Diagnosis not present

## 2022-04-12 DIAGNOSIS — R8290 Unspecified abnormal findings in urine: Secondary | ICD-10-CM | POA: Diagnosis not present

## 2022-04-12 DIAGNOSIS — H4050X0 Glaucoma secondary to other eye disorders, unspecified eye, stage unspecified: Secondary | ICD-10-CM | POA: Insufficient documentation

## 2022-06-04 DIAGNOSIS — R252 Cramp and spasm: Secondary | ICD-10-CM | POA: Diagnosis not present

## 2022-06-04 DIAGNOSIS — I83892 Varicose veins of left lower extremities with other complications: Secondary | ICD-10-CM | POA: Diagnosis not present

## 2022-06-04 DIAGNOSIS — I509 Heart failure, unspecified: Secondary | ICD-10-CM | POA: Diagnosis not present

## 2022-06-04 DIAGNOSIS — I872 Venous insufficiency (chronic) (peripheral): Secondary | ICD-10-CM | POA: Diagnosis not present

## 2022-06-07 DIAGNOSIS — I441 Atrioventricular block, second degree: Secondary | ICD-10-CM | POA: Diagnosis not present

## 2022-06-07 DIAGNOSIS — I25119 Atherosclerotic heart disease of native coronary artery with unspecified angina pectoris: Secondary | ICD-10-CM | POA: Diagnosis not present

## 2022-06-07 DIAGNOSIS — Z955 Presence of coronary angioplasty implant and graft: Secondary | ICD-10-CM | POA: Diagnosis not present

## 2022-06-07 DIAGNOSIS — E782 Mixed hyperlipidemia: Secondary | ICD-10-CM | POA: Diagnosis not present

## 2022-06-11 DIAGNOSIS — I25119 Atherosclerotic heart disease of native coronary artery with unspecified angina pectoris: Secondary | ICD-10-CM | POA: Diagnosis not present

## 2022-06-11 DIAGNOSIS — R42 Dizziness and giddiness: Secondary | ICD-10-CM | POA: Diagnosis not present

## 2022-06-11 DIAGNOSIS — R7303 Prediabetes: Secondary | ICD-10-CM | POA: Diagnosis not present

## 2022-06-11 DIAGNOSIS — Z5320 Procedure and treatment not carried out because of patient's decision for unspecified reasons: Secondary | ICD-10-CM | POA: Diagnosis not present

## 2022-06-14 DIAGNOSIS — H0102A Squamous blepharitis right eye, upper and lower eyelids: Secondary | ICD-10-CM | POA: Diagnosis not present

## 2022-06-14 DIAGNOSIS — H0102B Squamous blepharitis left eye, upper and lower eyelids: Secondary | ICD-10-CM | POA: Diagnosis not present

## 2022-06-14 DIAGNOSIS — Z961 Presence of intraocular lens: Secondary | ICD-10-CM | POA: Diagnosis not present

## 2022-06-14 DIAGNOSIS — H401131 Primary open-angle glaucoma, bilateral, mild stage: Secondary | ICD-10-CM | POA: Diagnosis not present

## 2022-06-14 DIAGNOSIS — H04123 Dry eye syndrome of bilateral lacrimal glands: Secondary | ICD-10-CM | POA: Diagnosis not present

## 2022-06-25 DIAGNOSIS — R42 Dizziness and giddiness: Secondary | ICD-10-CM | POA: Diagnosis not present

## 2022-06-25 DIAGNOSIS — R0989 Other specified symptoms and signs involving the circulatory and respiratory systems: Secondary | ICD-10-CM | POA: Diagnosis not present

## 2022-07-16 DIAGNOSIS — R0989 Other specified symptoms and signs involving the circulatory and respiratory systems: Secondary | ICD-10-CM | POA: Diagnosis not present

## 2022-07-16 DIAGNOSIS — I6523 Occlusion and stenosis of bilateral carotid arteries: Secondary | ICD-10-CM | POA: Diagnosis not present

## 2022-07-16 DIAGNOSIS — R42 Dizziness and giddiness: Secondary | ICD-10-CM | POA: Diagnosis not present

## 2022-07-23 DIAGNOSIS — I6523 Occlusion and stenosis of bilateral carotid arteries: Secondary | ICD-10-CM | POA: Diagnosis not present

## 2022-07-23 DIAGNOSIS — I2510 Atherosclerotic heart disease of native coronary artery without angina pectoris: Secondary | ICD-10-CM | POA: Diagnosis not present

## 2022-07-23 DIAGNOSIS — Z955 Presence of coronary angioplasty implant and graft: Secondary | ICD-10-CM | POA: Diagnosis not present

## 2022-07-23 DIAGNOSIS — I441 Atrioventricular block, second degree: Secondary | ICD-10-CM | POA: Diagnosis not present

## 2022-08-29 IMAGING — US US RENAL
1 series · 14 of 25 positions shown · non-contrast
Comparison: US Renal, 01/09/2019.

CLINICAL DATA: Chronic kidney disease, stage 3a

EXAM:
RENAL / URINARY TRACT ULTRASOUND COMPLETE

[Series 1: us renal · 0.22mm/px · 14 of 45 slices shown]
[im 1/45]
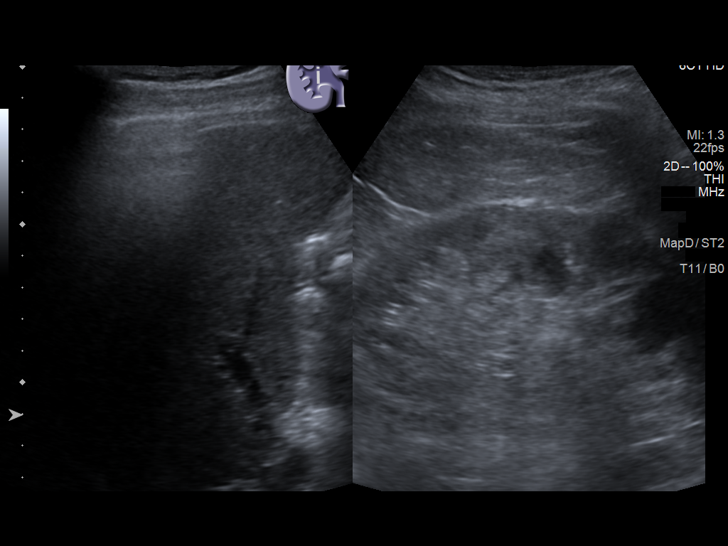
[im 4/45]
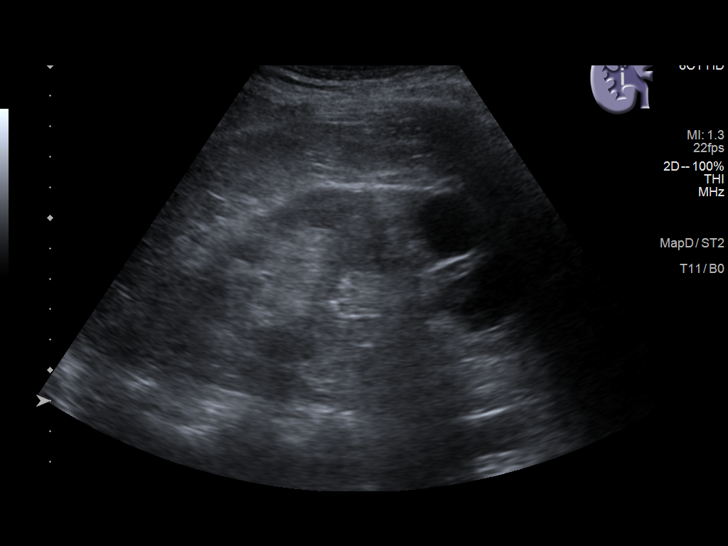
[im 8/45]
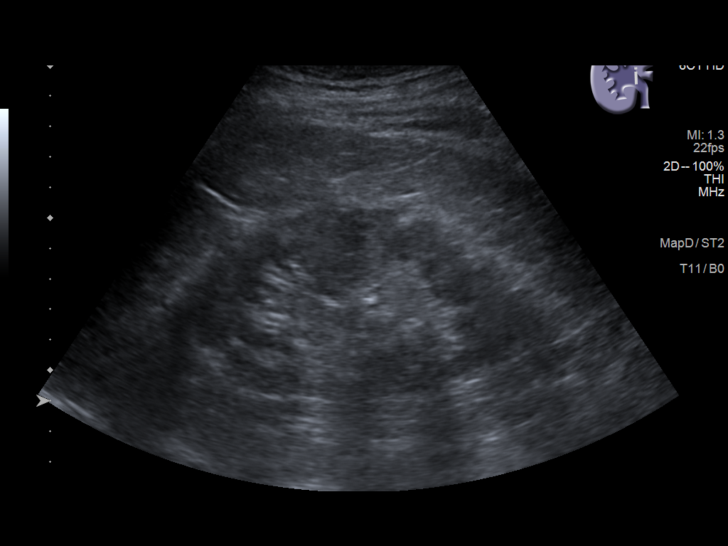
[im 12/45]
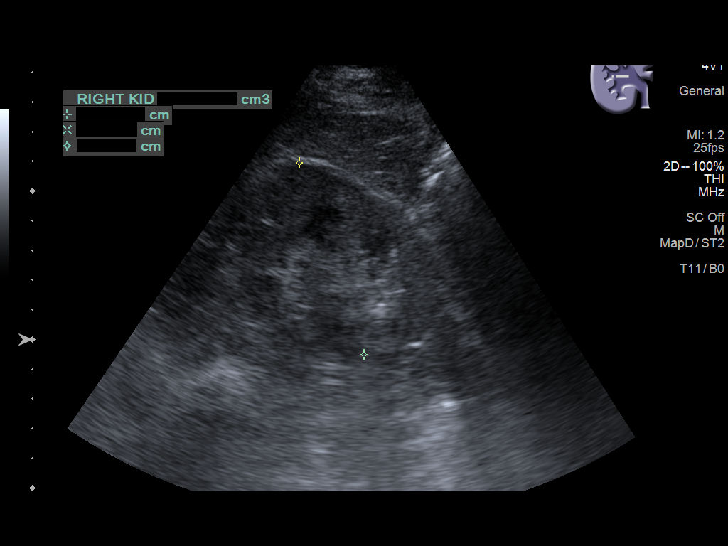
[im 15/45]
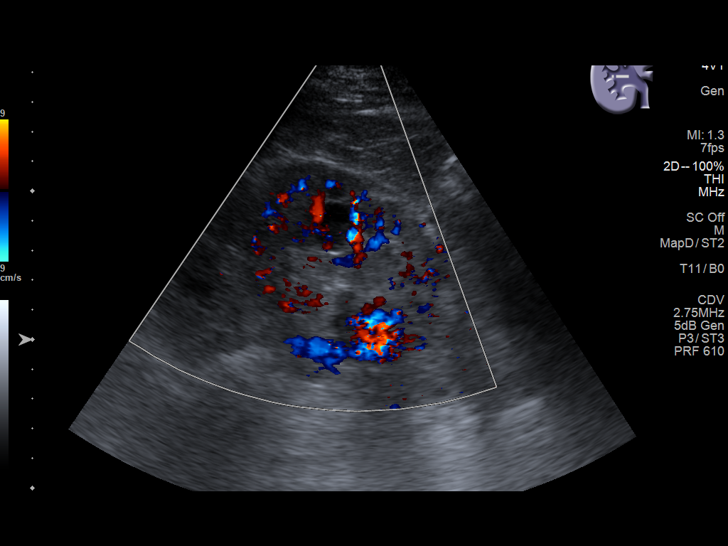
[im 17/45]
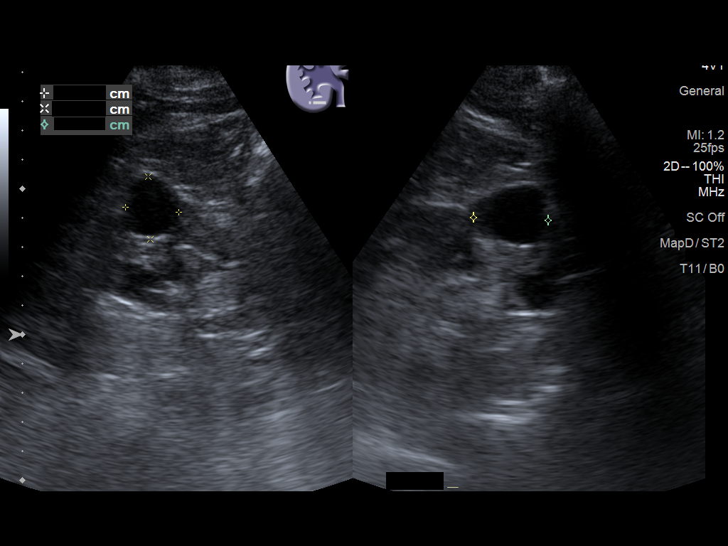
[im 21/45]
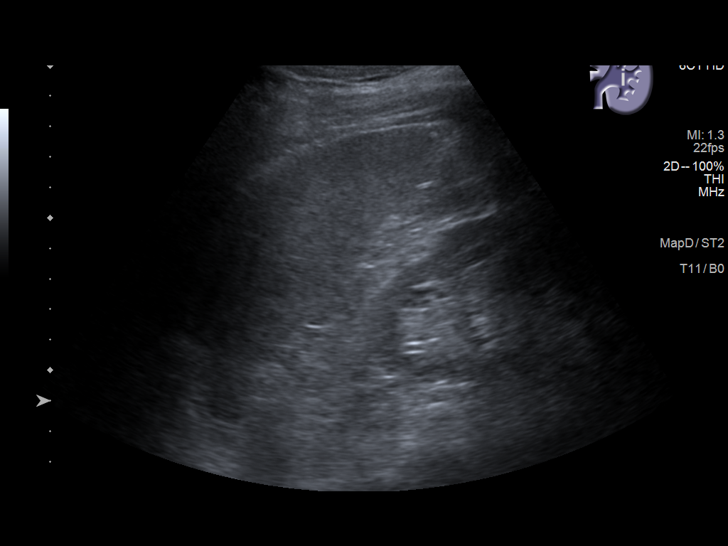
[im 24/45]
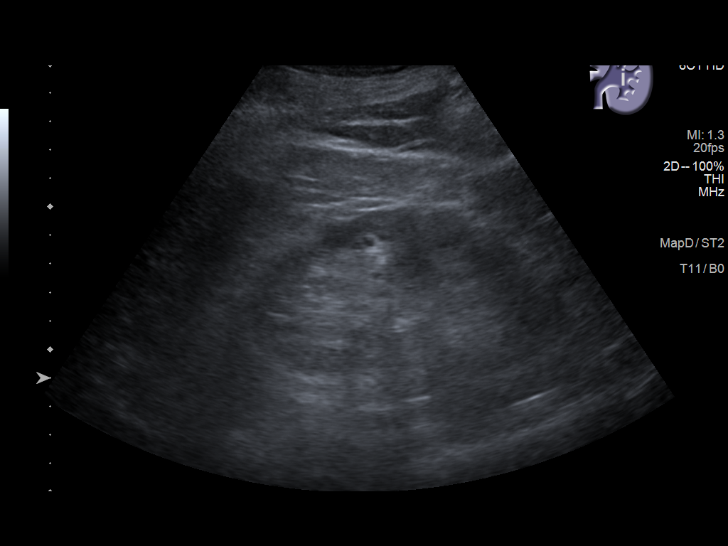
[im 28/45]
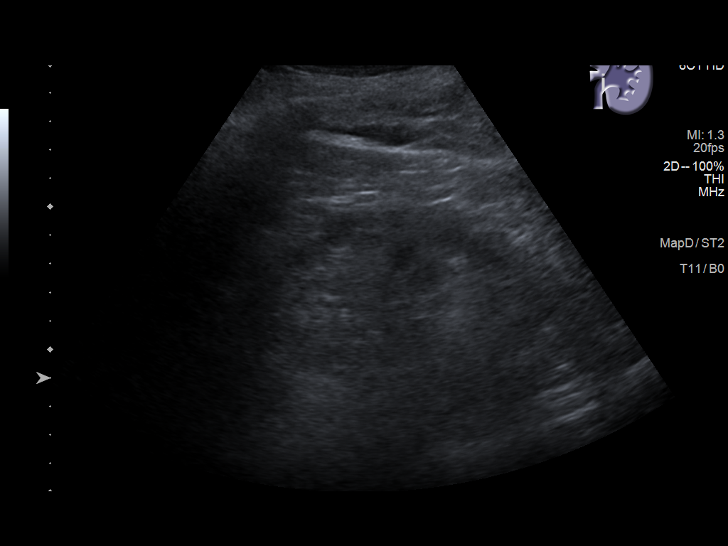
[im 30/45]
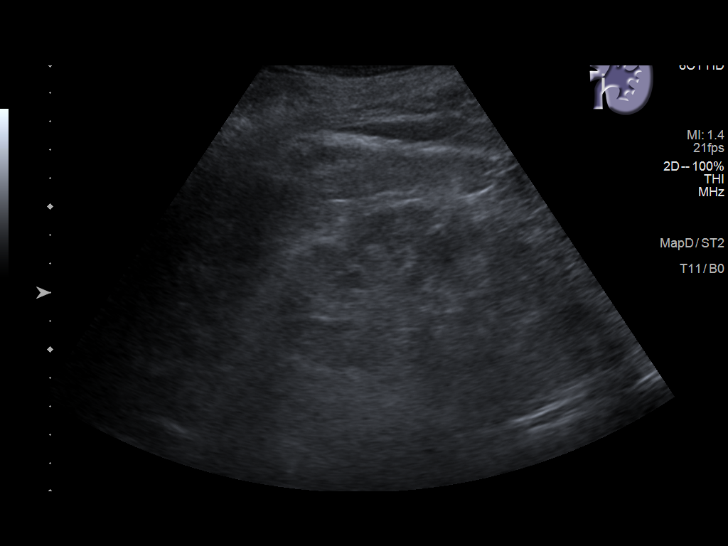
[im 34/45]
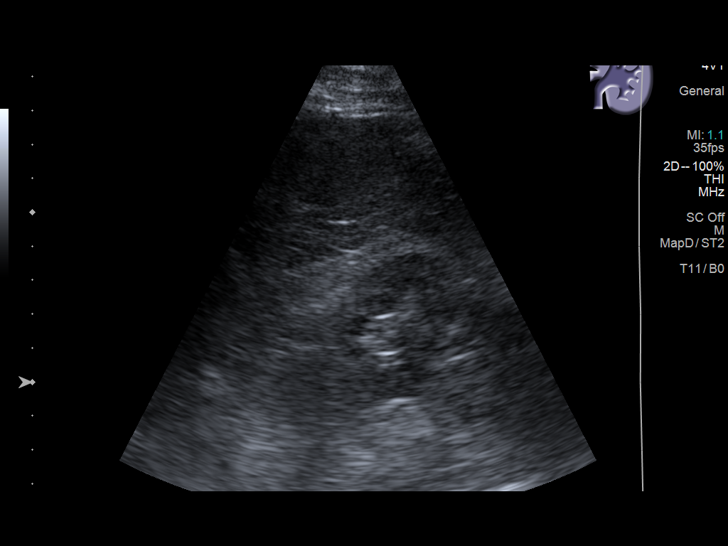
[im 37/45]
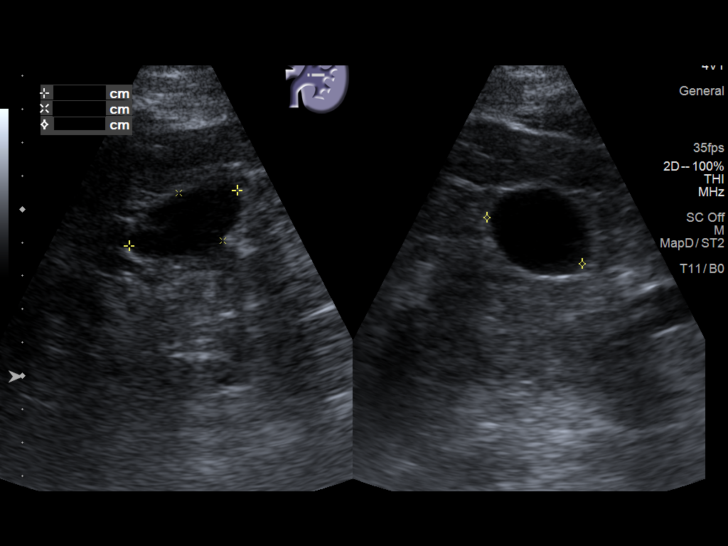
[im 41/45]
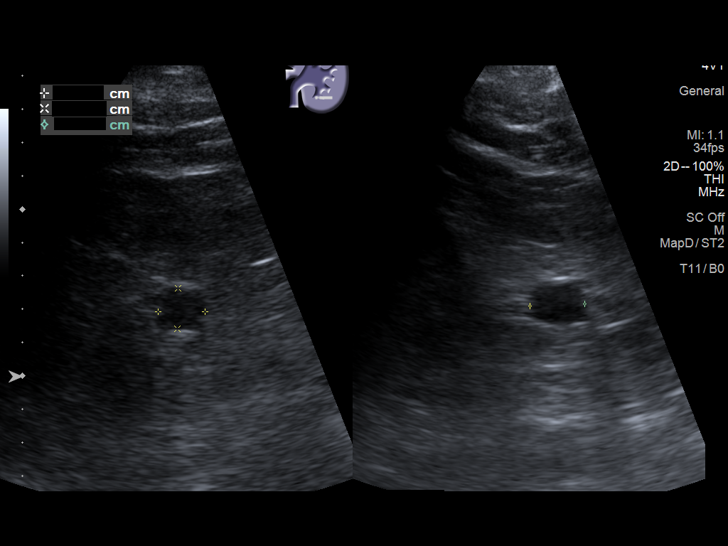
[im 45/45]
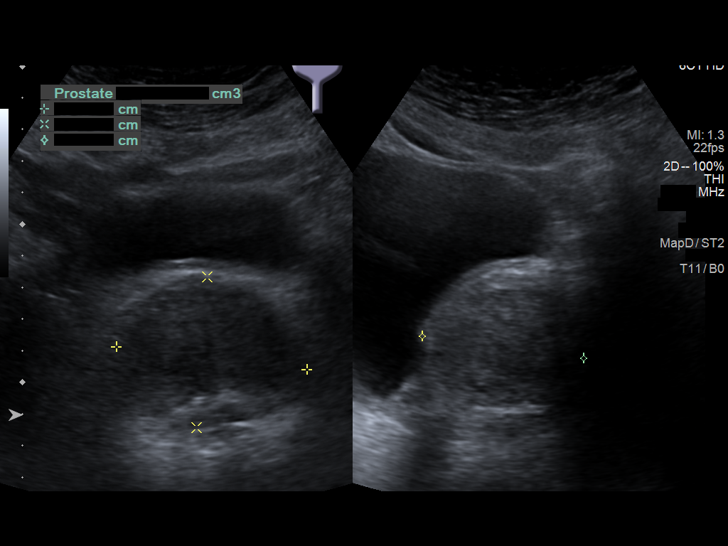

[14 of 25 positions shown; findings below may reference images not displayed]

FINDINGS: Right Kidney:

Renal measurements: 12.0 x 4.8 x 6.8 cm = volume: 246 mL. Increased
renal cortical echogenicity.

Similar appearance of rounded, well-circumscribed, anechoic renal
cortical and exophytic masses largest measuring up to 2.8 cm and
consistent with simple renal cysts. No hydronephrosis.

Left Kidney:

Renal measurements: 1.2 x 4.7 x 4.8 cm = volume: 162 mL. Increased
renal cortical echogenicity.

Similar appearance of rounded well-circumscribed anechoic renal
cortical and exophytic masses largest measuring up to 3.7 cm and
consistent with simple renal cysts. No hydronephrosis.

Bladder:

Appears normal for degree of bladder distention.

Other:

Prostatomegaly, measuring 6.1 x 4.8 x 0.2 cm with a calculated
volume of 78 cm^3
IMPRESSION: 1. Senescent renal change with increased cortical echogenicity, as
can be seen with medical renal disease
2. No hydronephrosis
3. Prostatomegaly.

## 2022-10-18 DIAGNOSIS — H401131 Primary open-angle glaucoma, bilateral, mild stage: Secondary | ICD-10-CM | POA: Diagnosis not present

## 2022-10-18 DIAGNOSIS — Z961 Presence of intraocular lens: Secondary | ICD-10-CM | POA: Diagnosis not present

## 2022-10-18 DIAGNOSIS — H526 Other disorders of refraction: Secondary | ICD-10-CM | POA: Diagnosis not present

## 2022-11-22 DIAGNOSIS — I441 Atrioventricular block, second degree: Secondary | ICD-10-CM | POA: Diagnosis not present

## 2022-11-22 DIAGNOSIS — K219 Gastro-esophageal reflux disease without esophagitis: Secondary | ICD-10-CM | POA: Diagnosis not present

## 2022-11-22 DIAGNOSIS — Z955 Presence of coronary angioplasty implant and graft: Secondary | ICD-10-CM | POA: Diagnosis not present

## 2022-11-22 DIAGNOSIS — I25119 Atherosclerotic heart disease of native coronary artery with unspecified angina pectoris: Secondary | ICD-10-CM | POA: Diagnosis not present

## 2022-11-22 DIAGNOSIS — I6523 Occlusion and stenosis of bilateral carotid arteries: Secondary | ICD-10-CM | POA: Diagnosis not present

## 2022-11-22 DIAGNOSIS — R42 Dizziness and giddiness: Secondary | ICD-10-CM | POA: Diagnosis not present

## 2022-12-10 DIAGNOSIS — M79662 Pain in left lower leg: Secondary | ICD-10-CM | POA: Diagnosis not present

## 2022-12-10 DIAGNOSIS — I83892 Varicose veins of left lower extremities with other complications: Secondary | ICD-10-CM | POA: Diagnosis not present

## 2022-12-24 DIAGNOSIS — I83892 Varicose veins of left lower extremities with other complications: Secondary | ICD-10-CM | POA: Diagnosis not present

## 2023-02-28 DIAGNOSIS — H401131 Primary open-angle glaucoma, bilateral, mild stage: Secondary | ICD-10-CM | POA: Diagnosis not present

## 2023-02-28 DIAGNOSIS — H0102B Squamous blepharitis left eye, upper and lower eyelids: Secondary | ICD-10-CM | POA: Diagnosis not present

## 2023-02-28 DIAGNOSIS — H0102A Squamous blepharitis right eye, upper and lower eyelids: Secondary | ICD-10-CM | POA: Diagnosis not present

## 2023-02-28 DIAGNOSIS — Z961 Presence of intraocular lens: Secondary | ICD-10-CM | POA: Diagnosis not present

## 2023-03-08 DIAGNOSIS — R490 Dysphonia: Secondary | ICD-10-CM | POA: Diagnosis not present

## 2023-03-08 DIAGNOSIS — K219 Gastro-esophageal reflux disease without esophagitis: Secondary | ICD-10-CM | POA: Diagnosis not present

## 2023-03-08 DIAGNOSIS — H6123 Impacted cerumen, bilateral: Secondary | ICD-10-CM | POA: Diagnosis not present

## 2023-04-03 DIAGNOSIS — H5213 Myopia, bilateral: Secondary | ICD-10-CM | POA: Diagnosis not present

## 2023-04-03 DIAGNOSIS — H5203 Hypermetropia, bilateral: Secondary | ICD-10-CM | POA: Diagnosis not present

## 2023-04-03 DIAGNOSIS — H524 Presbyopia: Secondary | ICD-10-CM | POA: Diagnosis not present

## 2023-04-03 DIAGNOSIS — H52209 Unspecified astigmatism, unspecified eye: Secondary | ICD-10-CM | POA: Diagnosis not present

## 2023-06-03 DIAGNOSIS — I6523 Occlusion and stenosis of bilateral carotid arteries: Secondary | ICD-10-CM | POA: Diagnosis not present

## 2023-06-03 DIAGNOSIS — K219 Gastro-esophageal reflux disease without esophagitis: Secondary | ICD-10-CM | POA: Diagnosis not present

## 2023-06-03 DIAGNOSIS — Z955 Presence of coronary angioplasty implant and graft: Secondary | ICD-10-CM | POA: Diagnosis not present

## 2023-06-03 DIAGNOSIS — I25119 Atherosclerotic heart disease of native coronary artery with unspecified angina pectoris: Secondary | ICD-10-CM | POA: Diagnosis not present

## 2023-06-03 DIAGNOSIS — E782 Mixed hyperlipidemia: Secondary | ICD-10-CM | POA: Diagnosis not present

## 2023-06-03 DIAGNOSIS — I2510 Atherosclerotic heart disease of native coronary artery without angina pectoris: Secondary | ICD-10-CM | POA: Diagnosis not present

## 2023-06-03 DIAGNOSIS — I441 Atrioventricular block, second degree: Secondary | ICD-10-CM | POA: Diagnosis not present

## 2023-06-03 DIAGNOSIS — R42 Dizziness and giddiness: Secondary | ICD-10-CM | POA: Diagnosis not present

## 2023-06-06 DIAGNOSIS — H401131 Primary open-angle glaucoma, bilateral, mild stage: Secondary | ICD-10-CM | POA: Diagnosis not present

## 2023-06-06 DIAGNOSIS — H0102A Squamous blepharitis right eye, upper and lower eyelids: Secondary | ICD-10-CM | POA: Diagnosis not present

## 2023-06-06 DIAGNOSIS — Z961 Presence of intraocular lens: Secondary | ICD-10-CM | POA: Diagnosis not present

## 2023-06-06 DIAGNOSIS — H0102B Squamous blepharitis left eye, upper and lower eyelids: Secondary | ICD-10-CM | POA: Diagnosis not present

## 2023-06-13 DIAGNOSIS — E782 Mixed hyperlipidemia: Secondary | ICD-10-CM | POA: Diagnosis not present

## 2023-06-13 DIAGNOSIS — I441 Atrioventricular block, second degree: Secondary | ICD-10-CM | POA: Diagnosis not present

## 2023-06-13 DIAGNOSIS — Z955 Presence of coronary angioplasty implant and graft: Secondary | ICD-10-CM | POA: Diagnosis not present

## 2023-06-13 DIAGNOSIS — I2510 Atherosclerotic heart disease of native coronary artery without angina pectoris: Secondary | ICD-10-CM | POA: Diagnosis not present

## 2023-09-06 DIAGNOSIS — H903 Sensorineural hearing loss, bilateral: Secondary | ICD-10-CM | POA: Diagnosis not present

## 2023-09-06 DIAGNOSIS — H6123 Impacted cerumen, bilateral: Secondary | ICD-10-CM | POA: Diagnosis not present

## 2023-12-02 DIAGNOSIS — E782 Mixed hyperlipidemia: Secondary | ICD-10-CM | POA: Diagnosis not present

## 2023-12-02 DIAGNOSIS — I441 Atrioventricular block, second degree: Secondary | ICD-10-CM | POA: Diagnosis not present

## 2023-12-02 DIAGNOSIS — I25119 Atherosclerotic heart disease of native coronary artery with unspecified angina pectoris: Secondary | ICD-10-CM | POA: Diagnosis not present

## 2023-12-02 DIAGNOSIS — R42 Dizziness and giddiness: Secondary | ICD-10-CM | POA: Diagnosis not present

## 2023-12-02 DIAGNOSIS — K219 Gastro-esophageal reflux disease without esophagitis: Secondary | ICD-10-CM | POA: Diagnosis not present

## 2023-12-02 DIAGNOSIS — I2510 Atherosclerotic heart disease of native coronary artery without angina pectoris: Secondary | ICD-10-CM | POA: Diagnosis not present

## 2023-12-02 DIAGNOSIS — Z955 Presence of coronary angioplasty implant and graft: Secondary | ICD-10-CM | POA: Diagnosis not present

## 2023-12-03 DIAGNOSIS — Z961 Presence of intraocular lens: Secondary | ICD-10-CM | POA: Diagnosis not present

## 2023-12-03 DIAGNOSIS — H518 Other specified disorders of binocular movement: Secondary | ICD-10-CM | POA: Diagnosis not present

## 2023-12-03 DIAGNOSIS — H0102B Squamous blepharitis left eye, upper and lower eyelids: Secondary | ICD-10-CM | POA: Diagnosis not present

## 2023-12-03 DIAGNOSIS — H0102A Squamous blepharitis right eye, upper and lower eyelids: Secondary | ICD-10-CM | POA: Diagnosis not present

## 2023-12-03 DIAGNOSIS — H401131 Primary open-angle glaucoma, bilateral, mild stage: Secondary | ICD-10-CM | POA: Diagnosis not present

## 2024-01-06 DIAGNOSIS — I25119 Atherosclerotic heart disease of native coronary artery with unspecified angina pectoris: Secondary | ICD-10-CM | POA: Diagnosis not present

## 2024-01-06 DIAGNOSIS — Z5320 Procedure and treatment not carried out because of patient's decision for unspecified reasons: Secondary | ICD-10-CM | POA: Diagnosis not present

## 2024-01-06 DIAGNOSIS — Z955 Presence of coronary angioplasty implant and graft: Secondary | ICD-10-CM | POA: Diagnosis not present

## 2024-01-21 DIAGNOSIS — I2510 Atherosclerotic heart disease of native coronary artery without angina pectoris: Secondary | ICD-10-CM | POA: Diagnosis not present

## 2024-01-22 DIAGNOSIS — Z955 Presence of coronary angioplasty implant and graft: Secondary | ICD-10-CM | POA: Diagnosis not present

## 2024-01-22 DIAGNOSIS — I2510 Atherosclerotic heart disease of native coronary artery without angina pectoris: Secondary | ICD-10-CM | POA: Diagnosis not present

## 2024-01-22 DIAGNOSIS — I441 Atrioventricular block, second degree: Secondary | ICD-10-CM | POA: Diagnosis not present

## 2024-01-22 DIAGNOSIS — E782 Mixed hyperlipidemia: Secondary | ICD-10-CM | POA: Diagnosis not present

## 2024-05-29 DIAGNOSIS — H40003 Preglaucoma, unspecified, bilateral: Secondary | ICD-10-CM | POA: Diagnosis not present

## 2024-05-29 DIAGNOSIS — Z961 Presence of intraocular lens: Secondary | ICD-10-CM | POA: Diagnosis not present

## 2024-05-29 DIAGNOSIS — Z0100 Encounter for examination of eyes and vision without abnormal findings: Secondary | ICD-10-CM | POA: Diagnosis not present

## 2024-06-01 DIAGNOSIS — E782 Mixed hyperlipidemia: Secondary | ICD-10-CM | POA: Diagnosis not present

## 2024-06-01 DIAGNOSIS — I6523 Occlusion and stenosis of bilateral carotid arteries: Secondary | ICD-10-CM | POA: Diagnosis not present

## 2024-06-01 DIAGNOSIS — I2510 Atherosclerotic heart disease of native coronary artery without angina pectoris: Secondary | ICD-10-CM | POA: Diagnosis not present

## 2024-06-01 DIAGNOSIS — R42 Dizziness and giddiness: Secondary | ICD-10-CM | POA: Diagnosis not present

## 2024-06-01 DIAGNOSIS — Z955 Presence of coronary angioplasty implant and graft: Secondary | ICD-10-CM | POA: Diagnosis not present

## 2024-06-01 DIAGNOSIS — Z5320 Procedure and treatment not carried out because of patient's decision for unspecified reasons: Secondary | ICD-10-CM | POA: Diagnosis not present

## 2024-06-01 DIAGNOSIS — K219 Gastro-esophageal reflux disease without esophagitis: Secondary | ICD-10-CM | POA: Diagnosis not present

## 2024-06-01 DIAGNOSIS — R079 Chest pain, unspecified: Secondary | ICD-10-CM | POA: Diagnosis not present

## 2024-06-01 DIAGNOSIS — R7303 Prediabetes: Secondary | ICD-10-CM | POA: Diagnosis not present

## 2024-06-29 DIAGNOSIS — H40003 Preglaucoma, unspecified, bilateral: Secondary | ICD-10-CM | POA: Diagnosis not present

## 2024-07-03 DIAGNOSIS — H40059 Ocular hypertension, unspecified eye: Secondary | ICD-10-CM | POA: Diagnosis not present

## 2024-07-31 DIAGNOSIS — R7303 Prediabetes: Secondary | ICD-10-CM | POA: Diagnosis not present

## 2024-07-31 DIAGNOSIS — I25119 Atherosclerotic heart disease of native coronary artery with unspecified angina pectoris: Secondary | ICD-10-CM | POA: Diagnosis not present

## 2024-08-05 DIAGNOSIS — N1831 Chronic kidney disease, stage 3a: Secondary | ICD-10-CM | POA: Diagnosis not present

## 2024-08-05 DIAGNOSIS — Z5320 Procedure and treatment not carried out because of patient's decision for unspecified reasons: Secondary | ICD-10-CM | POA: Diagnosis not present

## 2024-08-05 DIAGNOSIS — R053 Chronic cough: Secondary | ICD-10-CM | POA: Diagnosis not present

## 2024-08-05 DIAGNOSIS — R7303 Prediabetes: Secondary | ICD-10-CM | POA: Diagnosis not present

## 2024-08-05 DIAGNOSIS — Z0000 Encounter for general adult medical examination without abnormal findings: Secondary | ICD-10-CM | POA: Diagnosis not present

## 2024-08-05 DIAGNOSIS — Z1331 Encounter for screening for depression: Secondary | ICD-10-CM | POA: Diagnosis not present

## 2024-08-05 DIAGNOSIS — I2510 Atherosclerotic heart disease of native coronary artery without angina pectoris: Secondary | ICD-10-CM | POA: Diagnosis not present

## 2024-08-17 DIAGNOSIS — B351 Tinea unguium: Secondary | ICD-10-CM | POA: Diagnosis not present

## 2024-08-17 DIAGNOSIS — L600 Ingrowing nail: Secondary | ICD-10-CM | POA: Diagnosis not present

## 2024-08-17 DIAGNOSIS — R7303 Prediabetes: Secondary | ICD-10-CM | POA: Diagnosis not present

## 2024-08-17 DIAGNOSIS — M79675 Pain in left toe(s): Secondary | ICD-10-CM | POA: Diagnosis not present

## 2024-08-17 DIAGNOSIS — M79674 Pain in right toe(s): Secondary | ICD-10-CM | POA: Diagnosis not present

## 2024-10-07 DIAGNOSIS — Z955 Presence of coronary angioplasty implant and graft: Secondary | ICD-10-CM | POA: Diagnosis not present

## 2024-10-07 DIAGNOSIS — E782 Mixed hyperlipidemia: Secondary | ICD-10-CM | POA: Diagnosis not present

## 2024-10-07 DIAGNOSIS — Z8679 Personal history of other diseases of the circulatory system: Secondary | ICD-10-CM | POA: Diagnosis not present

## 2024-10-07 DIAGNOSIS — I2510 Atherosclerotic heart disease of native coronary artery without angina pectoris: Secondary | ICD-10-CM | POA: Diagnosis not present
